# Patient Record
Sex: Female | Born: 1990 | Hispanic: Yes | Marital: Married | Smoking: Never smoker
Health system: Southern US, Community
[De-identification: ages and names within clinical notes are randomized; demographics above are authoritative.]

## PROBLEM LIST (undated history)

## (undated) DIAGNOSIS — O009 Unspecified ectopic pregnancy without intrauterine pregnancy: Secondary | ICD-10-CM

## (undated) HISTORY — DX: Unspecified ectopic pregnancy without intrauterine pregnancy: O00.90

---

## 2016-02-17 ENCOUNTER — Encounter: Payer: Self-pay | Admitting: Emergency Medicine

## 2016-02-17 ENCOUNTER — Emergency Department
Admission: EM | Admit: 2016-02-17 | Discharge: 2016-02-17 | Disposition: A | Discharge status: Home / Self Care | Payer: Self-pay | Attending: Emergency Medicine | Admitting: Emergency Medicine

## 2016-02-17 DIAGNOSIS — K047 Periapical abscess without sinus: Secondary | ICD-10-CM | POA: Insufficient documentation

## 2016-02-17 MED ORDER — TRAMADOL HCL 50 MG PO TABS: 50.0000 mg | ORAL_TABLET | Freq: Four times a day (QID) | ORAL | 0 refills | Status: DC | PRN

## 2016-02-17 MED ORDER — AMOXICILLIN 500 MG PO TABS: 500.0000 mg | ORAL_TABLET | Freq: Three times a day (TID) | ORAL | 0 refills | Status: DC

## 2016-02-17 NOTE — Discharge Instructions (Signed)
Please call and schedule a dental appointment as soon as possible. You will need to be seen within the next 14 days. Return to the emergency department for symptoms that change or worsen if you're unable to schedule an appointment.  OPTIONS FOR DENTAL FOLLOW UP CARE  [place] Department of Health and Human Services - Local Safety Net Dental Clinics http://www.ncdhhs.gov/dph/oralhealth/services/safetynetclinics.htm   [facility] ([phone])  [facility] ([phone])  [facility] ([phone])  [facility] ([phone])  [facility] ([phone]) This clinic caters to the indigent population and is on a lottery system. Location: [facility], [address], [place] Clinic Hours: Wednesdays from 6pm - 9pm, patients seen by a lottery system. For dates, call or go to [email or web address]/[facility]/[email or web address] Services: Cleanings, fillings and simple extractions. Payment Options: DENTAL WORK IS FREE OF CHARGE. Bring proof of income or support. Best way to get seen: Arrive at 5:15 pm - this is a lottery, NOT first come/first serve, so arriving earlier will not increase your chances of being seen.     [facility] [phone] Select option 1 for emergencies   Location: [facility], [address], [place] Clinic Hours: No walk-ins accepted - call the day before to schedule an appointment. Check in times are 9:30 am and 1:30 pm. Services: Simple extractions, temporary fillings, pulpectomy/pulp debridement, uncomplicated abscess drainage. Payment Options: PAYMENT IS DUE AT THE TIME OF SERVICE.  Fee is usually $100-200, additional surgical procedures (e.g. abscess drainage) may be extra. Cash, checks, Visa/MasterCard accepted.  Can file Medicaid if patient is covered for dental - patient should call case worker to check. No  discount for [facility] patients. Best way to get seen: MUST call the day before and get onto the schedule. Can usually be seen the next 1-2 days. No walk-ins accepted.     [facility] [phone]   Location: [facility], [address], [place] [facility] Hours: M, W, Th, F 8am or 1:30pm, Tues 9a or 1:30 - first come/first served. Services: Simple extractions, temporary fillings, uncomplicated abscess drainage.  You do not need to be an [place] resident. Payment Options: PAYMENT IS DUE AT THE TIME OF SERVICE. Dental insurance, otherwise sliding scale - bring proof of income or support. Depending on income and treatment needed, cost is usually $50-200. Best way to get seen: Arrive early as it is first come/first served.     [facility] [phone]   Location: [address] Clinic Hours: Mon-Thu 8a-5p Services: Most basic dental services including extractions and fillings. Payment Options: PAYMENT IS DUE AT THE TIME OF SERVICE. Sliding scale, up to 50% off - bring proof if income or support. Medicaid with dental option accepted. Best way to get seen: Call to schedule an appointment, can usually be seen within 2 weeks OR they will try to see walk-ins - show up at 8a or 2p (you may have to wait).     [facility] [phone] [place] RESIDENTS ONLY   Location: [facility], [address], [place], [place] [address] Clinic Hours: By appointment only. Monday - Thursday 8am-5pm, Friday 8am-12pm Services: Cleanings, fillings, extractions. Payment Options: PAYMENT IS DUE AT THE TIME OF SERVICE. Cash, Visa or MasterCard. Sliding scale - $30 minimum per service. Best way to get seen: Come in to office, complete packet and make an appointment -   need proof of income or support monies for each household member and proof of [place]  residence. Usually takes about a month to get in.     [facility] [phone]   Location: [address] [place] Clinic Hours: Walk-in Urgent Care Dental Services are offered Monday-Friday mornings only. The numbers of emergencies accepted daily is limited to the number of providers available. Maximum 15 - Mondays, Wednesdays & Thursdays Maximum 10 - Tuesdays & Fridays Services: You do not need to be a [place] resident to be seen for a dental emergency. Emergencies are defined as pain, swelling, abnormal bleeding, or dental trauma. Walkins will receive x-rays if needed. NOTE: Dental cleaning is not an emergency. Payment Options: PAYMENT IS DUE AT THE TIME OF SERVICE. Minimum co-pay is $40.00 for uninsured patients. Minimum co-pay is $3.00 for Medicaid with dental coverage. Dental Insurance is accepted and must be presented at time of visit. Medicare does not cover dental. Forms of payment: Cash, credit card, checks. Best way to get seen: If not previously registered with the clinic, walk-in dental registration begins at 7:15 am and is on a first come/first serve basis. If previously registered with the clinic, call to make an appointment.     [facility] [phone] [place] RESIDENTS ONLY   Location: [address], [place], [place] Clinic Hours: Mon-Thu 10a-2p Services: Extractions only! Payment Options: FREE (donations accepted) - bring proof of income or support Best way to get seen: Call and schedule an appointment OR come at 8am on the 1st Monday of every month (except for holidays) when it is first come/first served.     [facility] [phone]   Location: [address], [place] Clinic Hours: Friday mornings Services, Payment Options, Best way to get seen: Call for info  

## 2016-02-17 NOTE — ED Provider Notes (Signed)
[facility] Emergency Department Provider Note ____________________________________________  Time seen: Approximately 4:32 PM  I have reviewed the triage vital signs and the nursing notes.   HISTORY  Chief Complaint Dental Pain   HPI Adrienne Santos is a 25 y.o. female who presents to the emergency department for evaluation of dental pain that started on Wednesday (4 days ago). She denies fever. Swelling to the left jaw for the past 2 days. She has taken ibuprofen without relief.  History reviewed. No pertinent past medical history.  There are no active problems to display for this patient.   Past Surgical History:  Procedure Laterality Date  . CESAREAN SECTION      Prior to Admission medications   Medication Sig Start Date End Date Taking? Authorizing Provider  amoxicillin (AMOXIL) 500 MG tablet Take 1 tablet (500 mg total) by mouth 3 (three) times daily. 02/17/16   Chinita Pesterari B Marvis Bakken, FNP  traMADol (ULTRAM) 50 MG tablet Take 1 tablet (50 mg total) by mouth every 6 (six) hours as needed. 02/17/16   Chinita Pesterari B Onalee Steinbach, FNP    Allergies Patient has no known allergies.  History reviewed. No pertinent family history.  Social History Social History  Substance Use Topics  . Smoking status: Never Smoker  . Smokeless tobacco: Not on file  . Alcohol use Yes    Review of Systems Constitutional: Tearful, uncomfortable appearing. ENT: Positive for dental pain. Musculoskeletal: Negative for trismus. Skin: Positive for swelling without edema. ____________________________________________   PHYSICAL EXAM:  VITAL SIGNS: ED Triage Vitals  Enc Vitals Group     BP 02/17/16 1551 133/84     Pulse Rate 02/17/16 1551 72     Resp 02/17/16 1551 18     Temp 02/17/16 1551 99.1 F (37.3 C)     Temp Source 02/17/16 1551 Oral     SpO2 02/17/16 1551 100 %     Weight 02/17/16 1554 218 lb (98.9 kg)     Height 02/17/16 1554 5\' 9"  (1.753 m)     Head  Circumference --      Peak Flow --      Pain Score 02/17/16 1557 10     Pain Loc --      Pain Edu? --      Excl. in GC? --     Constitutional: Alert and oriented. Well appearing and in no acute distress. Eyes: Conjunctivae are normal.EOMI. Mouth/Throat: Mucous membranes are moist. Oropharynx non-erythematous. Periodontal Exam    Hematological/Lymphatic/Immunilogical: No cervical lymphadenopathy. Respiratory: Normal respiratory effort.  Musculoskeletal: Full ROM x 4 extremities. Neurologic:  Normal speech and language. No gross focal neurologic deficits are appreciated. Speech is normal. No gait instability. Skin:  Swelling over the left maxilla without erythema. Psychiatric: Mood and affect are normal. Speech and behavior are normal.  ____________________________________________   LABS (all labs ordered are listed, but only abnormal results are displayed)  Labs Reviewed - No data to display ____________________________________________   RADIOLOGY  Not indicated. ____________________________________________   PROCEDURES  Procedure(s) performed: None  Critical Care performed: No  ____________________________________________   INITIAL IMPRESSION / ASSESSMENT AND PLAN / ED COURSE  Pertinent labs & imaging results that were available during my care of the patient were reviewed by me and considered in my medical decision making (see chart for details). Patient will be prescribed Amoxicillin and tramadol. Patient was advised to see the dentist within 14 days. Also advised to take the antibiotic until finished. Instructed to return to the ER for  symptoms that change or worsen if unable to schedule an appointment. ____________________________________________   FINAL CLINICAL IMPRESSION(S) / ED DIAGNOSES  Final diagnoses:  Dental abscess    Note:  This document was prepared using Dragon voice recognition software and may include unintentional dictation errors.      Chinita PesterCari B Samary Shatz, FNP 02/17/16 1700    Sharman CheekPhillip Stafford, MD 02/17/16 2308

## 2016-02-17 NOTE — ED Triage Notes (Addendum)
Left upper dental pain since Wednesday. Denies fevers. Has not called dentist. Some swelling to left cheek from tooth. No difficulty swallowing or breathing.

## 2016-06-04 ENCOUNTER — Emergency Department
Admission: EM | Admit: 2016-06-04 | Discharge: 2016-06-04 | Disposition: A | Discharge status: Home / Self Care | Payer: Self-pay | Attending: Emergency Medicine | Admitting: Emergency Medicine

## 2016-06-04 DIAGNOSIS — J111 Influenza due to unidentified influenza virus with other respiratory manifestations: Secondary | ICD-10-CM | POA: Insufficient documentation

## 2016-06-04 LAB — URINALYSIS, COMPLETE (UACMP) WITH MICROSCOPIC
BILIRUBIN URINE: NEGATIVE
Glucose, UA: NEGATIVE mg/dL
HGB URINE DIPSTICK: NEGATIVE
KETONES UR: 20 mg/dL — AB
LEUKOCYTES UA: NEGATIVE
NITRITE: NEGATIVE
PH: 5 (ref 5.0–8.0)
Protein, ur: NEGATIVE mg/dL
SPECIFIC GRAVITY, URINE: 1.025 (ref 1.005–1.030)

## 2016-06-04 LAB — COMPREHENSIVE METABOLIC PANEL
ALBUMIN: 4.1 g/dL (ref 3.5–5.0)
ALT: 18 U/L (ref 14–54)
ANION GAP: 6 (ref 5–15)
AST: 20 U/L (ref 15–41)
Alkaline Phosphatase: 42 U/L (ref 38–126)
BUN: 8 mg/dL (ref 6–20)
CHLORIDE: 105 mmol/L (ref 101–111)
CO2: 26 mmol/L (ref 22–32)
Calcium: 9 mg/dL (ref 8.9–10.3)
Creatinine, Ser: 0.74 mg/dL (ref 0.44–1.00)
GFR calc Af Amer: >60 mL/min (ref >60)
GLUCOSE: 102 mg/dL — AB (ref 65–99)
POTASSIUM: 3.7 mmol/L (ref 3.5–5.1)
Sodium: 137 mmol/L (ref 135–145)
Total Bilirubin: 0.4 mg/dL (ref 0.3–1.2)
Total Protein: 7.6 g/dL (ref 6.5–8.1)

## 2016-06-04 LAB — CBC
HCT: 38.9 % (ref 35.0–47.0)
Hemoglobin: 12.8 g/dL (ref 12.0–16.0)
MCH: 26.8 pg (ref 26.0–34.0)
MCHC: 32.8 g/dL (ref 32.0–36.0)
MCV: 81.7 fL (ref 80.0–100.0)
PLATELETS: 210 10*3/uL (ref 150–440)
RBC: 4.76 MIL/uL (ref 3.80–5.20)
RDW: 13.8 % (ref 11.5–14.5)
WBC: 3.9 10*3/uL (ref 3.6–11.0)

## 2016-06-04 LAB — INFLUENZA PANEL BY PCR (TYPE A & B)
INFLBPCR: POSITIVE — AB
Influenza A By PCR: NEGATIVE

## 2016-06-04 LAB — LIPASE, BLOOD: LIPASE: 33 U/L (ref 11–51)

## 2016-06-04 LAB — POCT PREGNANCY, URINE: Preg Test, Ur: NEGATIVE

## 2016-06-04 MED ORDER — ACETAMINOPHEN 325 MG PO TABS
ORAL_TABLET | ORAL | Status: AC
  Filled 2016-06-04: qty 2

## 2016-06-04 MED ORDER — OSELTAMIVIR PHOSPHATE 75 MG PO CAPS: 75.0000 mg | ORAL_CAPSULE | Freq: Two times a day (BID) | ORAL | 0 refills | Status: AC

## 2016-06-04 MED ORDER — IBUPROFEN 400 MG PO TABS: 600.0000 mg | ORAL_TABLET | Freq: Once | ORAL | Status: DC

## 2016-06-04 MED ORDER — ACETAMINOPHEN 325 MG PO TABS
650.0000 mg | ORAL_TABLET | Freq: Once | ORAL | Status: AC
  Administered 2016-06-04: 650 mg via ORAL

## 2016-06-04 MED ORDER — OSELTAMIVIR PHOSPHATE 75 MG PO CAPS
75.0000 mg | ORAL_CAPSULE | Freq: Once | ORAL | Status: AC
  Administered 2016-06-04: 75 mg via ORAL
  Filled 2016-06-04: qty 1

## 2016-06-04 MED ORDER — SODIUM CHLORIDE 0.9 % IV BOLUS (SEPSIS)
1000.0000 mL | Freq: Once | INTRAVENOUS | Status: AC
  Administered 2016-06-04: 1000 mL via INTRAVENOUS

## 2016-06-04 MED ORDER — ONDANSETRON HCL 4 MG PO TABS: 4.0000 mg | ORAL_TABLET | Freq: Every day | ORAL | 0 refills | Status: DC | PRN

## 2016-06-04 NOTE — ED Provider Notes (Signed)
[facility] Emergency Department Provider Note  ____________________________________________   None    (approximate)  I have reviewed the triage vital signs and the nursing notes.   HISTORY  Chief Complaint Fever   HPI Adrienne Santos is a 26 y.o. female without any chronic medical problems who is presenting to the emergency department tonight with lower abdominal pain as well as nausea vomiting, diarrhea and a cough and runny nose since yesterday. She also has a child at home with similar symptoms. She says the pain is not intended lower abdomen and cramping. She denies any radiation of the pain. Denies any burning with urination. Denies any blood in the stool or vomitus.   No past medical history on file.  There are no active problems to display for this patient.   Past Surgical History:  Procedure Laterality Date  . CESAREAN SECTION      Prior to Admission medications   Medication Sig Start Date End Date Taking? Authorizing Provider  amoxicillin (AMOXIL) 500 MG tablet Take 1 tablet (500 mg total) by mouth 3 (three) times daily. 02/17/16   Chinita Pesterari B Triplett, FNP  ondansetron (ZOFRAN) 4 MG tablet Take 1 tablet (4 mg total) by mouth daily as needed. 06/04/16   Myrna Blazeravid Matthew Schaevitz, MD  oseltamivir (TAMIFLU) 75 MG capsule Take 1 capsule (75 mg total) by mouth 2 (two) times daily. 06/04/16 06/09/16  Myrna Blazeravid Matthew Schaevitz, MD  traMADol (ULTRAM) 50 MG tablet Take 1 tablet (50 mg total) by mouth every 6 (six) hours as needed. 02/17/16   Chinita Pesterari B Triplett, FNP    Allergies Patient has no known allergies.  No family history on file.  Social History Social History  Substance Use Topics  . Smoking status: Never Smoker  . Smokeless tobacco: Not on file  . Alcohol use Yes    Review of Systems Constitutional:fever Eyes: No visual changes. ENT: No sore throat. Cardiovascular: Denies chest pain. Respiratory: Denies shortness of  breath. Gastrointestinal:   No constipation. Genitourinary: Negative for dysuria. Musculoskeletal: Negative for back pain. Skin: Negative for rash. Neurological: Negative for headaches, focal weakness or numbness.  10-point ROS otherwise negative.  ____________________________________________   PHYSICAL EXAM:  VITAL SIGNS: ED Triage Vitals [06/04/16 2005]  Enc Vitals Group     BP 105/71     Pulse Rate (!) 128     Resp 20     Temp (!) 102.7 F (39.3 C)     Temp Source Oral     SpO2 100 %     Weight 218 lb (98.9 kg)     Height 5\' 9"  (1.753 m)     Head Circumference      Peak Flow      Pain Score 9     Pain Loc      Pain Edu?      Excl. in GC?     Constitutional: Alert and oriented. Well appearing and in no acute distress. Eyes: Conjunctivae are normal. PERRL. EOMI. Head: Atraumatic. Nose: No congestion/rhinnorhea. Mouth/Throat: Mucous membranes are moist.  Oropharynx non-erythematous. Neck: No stridor.   Cardiovascular: tachycardic, regular rhythm. Grossly normal heart sounds.   Respiratory: Normal respiratory effort.  No retractions. Lungs CTAB. Gastrointestinal: Soft with mild tenderness diffusely which slightly worse across the lower abdomen. There is no focal tenderness. Specifically no focal tenderness over McBurney's point. Negative Murphy sign. No distention. . Musculoskeletal: No lower extremity tenderness nor edema.  No joint effusions. Neurologic:  Normal speech and language. No  gross focal neurologic deficits are appreciated. No gait instability. Skin:  Skin is warm, dry and intact. No rash noted. Psychiatric: Mood and affect are normal. Speech and behavior are normal.  ____________________________________________   LABS (all labs ordered are listed, but only abnormal results are displayed)  Labs Reviewed  COMPREHENSIVE METABOLIC PANEL - Abnormal; Notable for the following:       Result Value   Glucose, Bld 102 (*)    All other components within  normal limits  URINALYSIS, COMPLETE (UACMP) WITH MICROSCOPIC - Abnormal; Notable for the following:    Color, Urine YELLOW (*)    APPearance CLEAR (*)    Ketones, ur 20 (*)    Bacteria, UA RARE (*)    Squamous Epithelial / LPF 0-5 (*)    All other components within normal limits  INFLUENZA PANEL BY PCR (TYPE A & B) - Abnormal; Notable for the following:    Influenza B By PCR POSITIVE (*)    All other components within normal limits  CBC  LIPASE, BLOOD  POC URINE PREG, ED  POCT PREGNANCY, URINE   ____________________________________________  EKG   ____________________________________________  RADIOLOGY   ____________________________________________   PROCEDURES  Procedure(s) performed:   Procedures  Critical Care performed:   ____________________________________________   INITIAL IMPRESSION / ASSESSMENT AND PLAN / ED COURSE  Pertinent labs & imaging results that were available during my care of the patient were reviewed by me and considered in my medical decision making (see chart for details).  ----------------------------------------- 10:48 PM on 06/04/2016 -----------------------------------------  Patient at this time with a heart rate of 94 and temperature 99.5. Positive for influenza B.  No vomiting in the emergency department. The diagnosis as well as treatment plan the patient and her family who is at the bedside. The patient was her on Tamiflu because she is within the 48 hour window. She will also given Zofran for nausea prior to arrival and possible side effect of nausea from the Tamiflu. She does not report a primary care doctor. I will give her the [facility]. She is understanding of the plan and willing to comply. To return for any worsening or concerning symptoms.      ____________________________________________   FINAL CLINICAL IMPRESSION(S) / ED DIAGNOSES  Final diagnoses:  Influenza      NEW  MEDICATIONS STARTED DURING THIS VISIT:  New Prescriptions   ONDANSETRON (ZOFRAN) 4 MG TABLET    Take 1 tablet (4 mg total) by mouth daily as needed.   OSELTAMIVIR (TAMIFLU) 75 MG CAPSULE    Take 1 capsule (75 mg total) by mouth 2 (two) times daily.     Note:  This document was prepared using Dragon voice recognition software and may include unintentional dictation errors.    Myrna Blazer, MD 06/04/16 [phone]

## 2016-06-04 NOTE — ED Triage Notes (Signed)
Pt has n.v.d since yest and lower abd pain, states has also had fever. Denies any dysuria also co bilat breast pain.

## 2016-07-08 ENCOUNTER — Encounter: Payer: Self-pay | Admitting: *Deleted

## 2016-07-08 ENCOUNTER — Emergency Department: Payer: Self-pay

## 2016-07-08 ENCOUNTER — Emergency Department
Admission: EM | Admit: 2016-07-08 | Discharge: 2016-07-08 | Disposition: A | Discharge status: Home / Self Care | Payer: Self-pay | Attending: Emergency Medicine | Admitting: Emergency Medicine

## 2016-07-08 DIAGNOSIS — N898 Other specified noninflammatory disorders of vagina: Secondary | ICD-10-CM | POA: Insufficient documentation

## 2016-07-08 DIAGNOSIS — O4690 Antepartum hemorrhage, unspecified, unspecified trimester: Secondary | ICD-10-CM

## 2016-07-08 DIAGNOSIS — N939 Abnormal uterine and vaginal bleeding, unspecified: Secondary | ICD-10-CM

## 2016-07-08 DIAGNOSIS — Z3A00 Weeks of gestation of pregnancy not specified: Secondary | ICD-10-CM | POA: Insufficient documentation

## 2016-07-08 DIAGNOSIS — O209 Hemorrhage in early pregnancy, unspecified: Secondary | ICD-10-CM | POA: Insufficient documentation

## 2016-07-08 LAB — URINALYSIS, COMPLETE (UACMP) WITH MICROSCOPIC
Bacteria, UA: NONE SEEN
Bilirubin Urine: NEGATIVE
Glucose, UA: NEGATIVE mg/dL
KETONES UR: NEGATIVE mg/dL
Leukocytes, UA: NEGATIVE
Nitrite: NEGATIVE
PH: 6 (ref 5.0–8.0)
Protein, ur: NEGATIVE mg/dL
Specific Gravity, Urine: 1.018 (ref 1.005–1.030)

## 2016-07-08 LAB — ABO/RH: ABO/RH(D): A POS

## 2016-07-08 LAB — CBC
HCT: 34.1 % — ABNORMAL LOW (ref 35.0–47.0)
Hemoglobin: 11.3 g/dL — ABNORMAL LOW (ref 12.0–16.0)
MCH: 27 pg (ref 26.0–34.0)
MCHC: 33 g/dL (ref 32.0–36.0)
MCV: 81.7 fL (ref 80.0–100.0)
Platelets: 331 10*3/uL (ref 150–440)
RBC: 4.17 MIL/uL (ref 3.80–5.20)
RDW: 14.4 % (ref 11.5–14.5)
WBC: 7.9 10*3/uL (ref 3.6–11.0)

## 2016-07-08 LAB — HCG, QUANTITATIVE, PREGNANCY: HCG, BETA CHAIN, QUANT, S: 247 m[IU]/mL — AB (ref <5)

## 2016-07-08 LAB — POCT PREGNANCY, URINE: Preg Test, Ur: POSITIVE — AB

## 2016-07-08 NOTE — ED Provider Notes (Signed)
[facility] Emergency Department Provider Note   ____________________________________________   I have reviewed the triage vital signs and the nursing notes.   HISTORY  Chief Complaint Vaginal Bleeding   History limited by: Language [facility] Interpreter utilized   HPI Adrienne Santos is a 26 y.o. female who presents to the emergency department today because of concerns for vaginal bleeding. The patient states that she had her previous. Roughly 1 month ago. Since that time she has had some vaginal spotting although has had heavy vaginal bleeding for the past 10 days. She did have some blood clots however has not had blood clots past 3 days. This has been accompanied by some abdominal cramping primarily lubricated in the suprapubic and right lower quadrant. Patient states that typically she is fairly regular. She was seen in the emergency department little over a month ago and had a negative pregnancy test.   History reviewed. No pertinent past medical history.  There are no active problems to display for this patient.   Past Surgical History:  Procedure Laterality Date  . CESAREAN SECTION      Prior to Admission medications   Medication Sig Start Date End Date Taking? Authorizing Provider  amoxicillin (AMOXIL) 500 MG tablet Take 1 tablet (500 mg total) by mouth 3 (three) times daily. 02/17/16   Chinita Pester, FNP  ondansetron (ZOFRAN) 4 MG tablet Take 1 tablet (4 mg total) by mouth daily as needed. 06/04/16   Myrna Blazer, MD  traMADol (ULTRAM) 50 MG tablet Take 1 tablet (50 mg total) by mouth every 6 (six) hours as needed. 02/17/16   Chinita Pester, FNP    Allergies Patient has no known allergies.  History reviewed. No pertinent family history.  Social History Social History  Substance Use Topics  . Smoking status: Never Smoker  . Smokeless tobacco: Not on file  . Alcohol use Yes    Review of  Systems  Constitutional: Negative for fever. Cardiovascular: Negative for chest pain. Respiratory: Negative for shortness of breath. Gastrointestinal: Positive for abdominal pain. Genitourinary: Positive for vaginal bleeding. Musculoskeletal: Negative for back pain. Skin: Negative for rash. Neurological: Negative for headaches, focal weakness or numbness.  10-point ROS otherwise negative.  ____________________________________________   PHYSICAL EXAM:  VITAL SIGNS: ED Triage Vitals  Enc Vitals Group     BP 07/08/16 1644 114/67     Pulse Rate 07/08/16 1644 78     Resp 07/08/16 1644 18     Temp 07/08/16 1644 98.6 F (37 C)     Temp Source 07/08/16 1644 Oral     SpO2 07/08/16 1644 100 %     Weight 07/08/16 1647 219 lb (99.3 kg)     Height 07/08/16 1647 5\' 9"  (1.753 m)     Head Circumference --      Peak Flow --      Pain Score 07/08/16 1647 10    Constitutional: Alert and oriented. Well appearing and in no distress. Eyes: Conjunctivae are normal. Normal extraocular movements. ENT   Head: Normocephalic and atraumatic.   Nose: No congestion/rhinnorhea.   Mouth/Throat: Mucous membranes are moist.   Neck: No stridor. Hematological/Lymphatic/Immunilogical: No cervical lymphadenopathy. Cardiovascular: Normal rate, regular rhythm.  No murmurs, rubs, or gallops.  Respiratory: Normal respiratory effort without tachypnea nor retractions. Breath sounds are clear and equal bilaterally. No wheezes/rales/rhonchi. Gastrointestinal: Soft and minimally tender in the suprapubic region. No rebound. No guarding.  Genitourinary: Deferred Musculoskeletal: Normal range of motion in  all extremities. No lower extremity edema. Neurologic:  Normal speech and language. No gross focal neurologic deficits are appreciated.  Skin:  Skin is warm, dry and intact. No rash noted. Psychiatric: Mood and affect are normal. Speech and behavior are normal. Patient exhibits appropriate insight and  judgment.  ____________________________________________    LABS (pertinent positives/negatives)  Labs Reviewed  CBC - Abnormal; Notable for the following:       Result Value   Hemoglobin 11.3 (*)    HCT 34.1 (*)    All other components within normal limits  HCG, QUANTITATIVE, PREGNANCY - Abnormal; Notable for the following:    hCG, Beta Chain, Quant, S 247 (*)    All other components within normal limits  URINALYSIS, COMPLETE (UACMP) WITH MICROSCOPIC - Abnormal; Notable for the following:    Color, Urine YELLOW (*)    APPearance HAZY (*)    Hgb urine dipstick LARGE (*)    Squamous Epithelial / LPF 0-5 (*)    All other components within normal limits  POCT PREGNANCY, URINE - Abnormal; Notable for the following:    Preg Test, Ur POSITIVE (*)    All other components within normal limits  POC URINE PREG, ED  ABO/RH     ____________________________________________   EKG  None  ____________________________________________    RADIOLOGY  US IMPRESSION: Complex fluid in the endometrial canal consistent with hemorrhage. There is no evidence of intrauterine pregnancy. No definable ectopic pregnancy. Follow-up obviously suggested.   ____________________________________________   PROCEDURES  Procedures  ____________________________________________   INITIAL IMPRESSION / ASSESSMENT AND PLAN / ED COURSE  Pertinent labs & imaging results that were available during my care of the patient were reviewed by me and considered in my medical decision making (see chart for details).   Patient presented to the emergency department today because of concerns for vaginal bleeding. Here a urine pregnancy test was positive. An ultrasound was performed which did not show obvious location of pregnancy. Patient in no acute distress. The patient's bhg 247. Discussed with patient that it could be a very early pregnancy and that she will need repeat testing. Will give ob/gyn follow  up.  ____________________________________________   FINAL CLINICAL IMPRESSION(S) / ED DIAGNOSES  Final diagnoses:  Vaginal bleeding  Vaginal bleeding in pregnancy     Note: This dictation was prepared with Dragon dictation. Any transcriptional errors that result from this process are unintentional     Phineas SemenGraydon Cadan Maggart, MD 07/08/16 [phone]

## 2016-07-08 NOTE — Discharge Instructions (Signed)
It is important that you have repeat blood testing done in 2-3 days. Please seek medical attention for any high fevers, chest pain, shortness of breath, change in behavior, persistent vomiting, bloody stool or any other new or concerning symptoms.

## 2016-07-08 NOTE — ED Triage Notes (Signed)
interpreter # [phone], states vaginal bleeding for 10 days, states LMP was 2/26, states bright red blood, awake and alert

## 2016-07-08 NOTE — ED Notes (Signed)
Interpreter requested 

## 2016-07-18 ENCOUNTER — Ambulatory Visit (INDEPENDENT_AMBULATORY_CARE_PROVIDER_SITE_OTHER): Payer: Self-pay | Admitting: Obstetrics and Gynecology

## 2016-07-18 ENCOUNTER — Encounter: Payer: Self-pay | Admitting: Obstetrics and Gynecology

## 2016-07-18 DIAGNOSIS — O009 Unspecified ectopic pregnancy without intrauterine pregnancy: Secondary | ICD-10-CM

## 2016-07-18 DIAGNOSIS — O200 Threatened abortion: Secondary | ICD-10-CM

## 2016-07-18 NOTE — Progress Notes (Signed)
Obstetrics & Gynecology Office Visit   Chief Complaint  Patient presents with  . Threatened Miscarriage versus ectopic pregnancy.  Referred by [facility]   History of Present Illness:  Threatened Abortion: Patient at 5 and 3/[redacted] weeks gestation by LMP.  Patient reports bleeding and pelvic pain since 07/08/16.  She is not in acute distress.  Ectopic risks include none. Cycle length is regular. Pregnancy testing: qual HCG positive on 07/08/16. Pregnancy imaging: transvaginal ultrasound done on 07/08/16. Result no IUP, no evidence of ectopic.  Blood type: A positive.  Other lab results: quant hCG on 3/27 247. Quantitative hCG history: 3/27: 261 at [facility] 3/28: 153 at [facility] (lab report faxed to me) 4/2: 232 at [facility] (lab report faxed to me)  She continues to have daily bleeding, changing pads 10 times per day. She states she has RLQ pain that is slightly better than before, but still present.  Review of Systems: Review of Systems  Constitutional: Negative.   HENT: Negative.   Eyes: Negative.   Respiratory: Negative.   Cardiovascular: Negative.   Gastrointestinal: Positive for abdominal pain (RLQ).  Genitourinary:       Vaginal bleeding since 07/08/16  Musculoskeletal: Negative.   Skin: Negative.   Neurological: Negative.   Endo/Heme/Allergies: Negative.   Psychiatric/Behavioral: Negative.     Past Medical History: None   Past Surgical History:  Procedure Laterality Date  . CESAREAN SECTION     Gynecologic History: about 06/10/16 per patient  Obstetric History: G2P1001, s/p primary c-section, complicated by preeclampsia  Family History  Problem Relation Age of Onset  . Hypertension Mother   . Diabetes Mother    Social History   Social History  . Marital status: Married    Spouse name: N/A  . Number of children: N/A  . Years of education: N/A   Occupational History  . Not on file.   Social History Main Topics  . Smoking status: Never  Smoker  . Smokeless tobacco: Never Used  . Alcohol use Yes  . Drug use: No  . Sexual activity: Yes    Birth control/ protection: None   Other Topics Concern  . Not on file   Social History Narrative  . No narrative on file   Allergies: No Known Allergies  Medications:   Medication Sig Start Date End Date Taking? Authorizing Provider  acetaminophen (TYLENOL) 160 MG/5ML elixir Take 15 mg/kg by mouth every 4 (four) hours as needed for fever.   Yes Historical Provider, MD   Physical Exam BP 110/70   Pulse 79   Ht  (1.753 m)   Wt 219 lb (99.3 kg)   BMI 32.34 kg/m   No LMP recorded.  General: NAD HEENT: normocephalic, anicteric Thyroid: no enlargement, no palpable nodules Pulmonary: No increased work of breathing Cardiovascular: RRR, distal pulses 2+ Abdomen: NABS, soft, non-tender, non-distended.  Umbilicus without lesions.  No hepatomegaly, splenomegaly or masses palpable. No evidence of hernia  Genitourinary: deferred Extremities: no edema, erythema, or tenderness Neurologic: Grossly intact Psychiatric: mood appropriate, affect full  Assessment: 26 y.o. G2P1001 with incomplete spontaneous abortion versus ectopic pregnancy.  Given the decrease in her hCG levels followed by a rise, this pregnancy is not normal.  Discussed the above possibilities. Her pain increases the risk of an ectopic pregnancy, especially since it is localized.   Discussed surgical versus medical management.  Plan: Recommend she present to [facility] ER for management of her abnormal pregnancy that has a high  probability to be an ectopic. She will need 107.  of methotrexate followed by the usual labs for a single-dose protocol.  Reviewed the risks of MTX and the risks of an untreated ectopic pregnancy. We discussed that I could not say definitively that the pregnancy is ectopic.  However, there is a high likelihood.  She voiced understanding and agreement with the plan.  30 minutes spent in face to face  discussion with > 50% spent in counseling and management of her abnormal pregnancy.   Thomasene Mohair, MD 07/18/2016 5:33 PM

## 2016-07-19 ENCOUNTER — Inpatient Hospital Stay (HOSPITAL_COMMUNITY)
Admission: AD | Admit: 2016-07-19 | Discharge: 2016-07-19 | Disposition: A | Discharge status: Home / Self Care | Payer: Self-pay | Source: Ambulatory Visit | Attending: Obstetrics and Gynecology | Admitting: Obstetrics and Gynecology

## 2016-07-19 ENCOUNTER — Inpatient Hospital Stay (HOSPITAL_COMMUNITY): Payer: Self-pay

## 2016-07-19 ENCOUNTER — Encounter (HOSPITAL_COMMUNITY): Payer: Self-pay

## 2016-07-19 DIAGNOSIS — Z8249 Family history of ischemic heart disease and other diseases of the circulatory system: Secondary | ICD-10-CM | POA: Insufficient documentation

## 2016-07-19 DIAGNOSIS — O209 Hemorrhage in early pregnancy, unspecified: Secondary | ICD-10-CM | POA: Insufficient documentation

## 2016-07-19 DIAGNOSIS — Z3A00 Weeks of gestation of pregnancy not specified: Secondary | ICD-10-CM | POA: Insufficient documentation

## 2016-07-19 DIAGNOSIS — Z833 Family history of diabetes mellitus: Secondary | ICD-10-CM | POA: Insufficient documentation

## 2016-07-19 LAB — COMPREHENSIVE METABOLIC PANEL
ALBUMIN: 3.8 g/dL (ref 3.5–5.0)
ALT: 20 U/L (ref 14–54)
ANION GAP: 6 (ref 5–15)
AST: 18 U/L (ref 15–41)
Alkaline Phosphatase: 37 U/L — ABNORMAL LOW (ref 38–126)
BUN: 14 mg/dL (ref 6–20)
CO2: 28 mmol/L (ref 22–32)
Calcium: 8.9 mg/dL (ref 8.9–10.3)
Chloride: 103 mmol/L (ref 101–111)
Creatinine, Ser: 0.54 mg/dL (ref 0.44–1.00)
GFR calc Af Amer: >60 mL/min (ref >60)
GFR calc non Af Amer: >60 mL/min (ref >60)
GLUCOSE: 92 mg/dL (ref 65–99)
Potassium: 4 mmol/L (ref 3.5–5.1)
SODIUM: 137 mmol/L (ref 135–145)
Total Bilirubin: 0.3 mg/dL (ref 0.3–1.2)
Total Protein: 7.2 g/dL (ref 6.5–8.1)

## 2016-07-19 LAB — CBC WITH DIFFERENTIAL/PLATELET
BASOS PCT: 1 %
Basophils Absolute: 0 10*3/uL (ref 0.0–0.1)
EOS ABS: 0.1 10*3/uL (ref 0.0–0.7)
Eosinophils Relative: 2 %
HCT: 33.6 % — ABNORMAL LOW (ref 36.0–46.0)
Hemoglobin: 10.8 g/dL — ABNORMAL LOW (ref 12.0–15.0)
Lymphocytes Relative: 36 %
Lymphs Abs: 2 10*3/uL (ref 0.7–4.0)
MCH: 26.5 pg (ref 26.0–34.0)
MCHC: 32.1 g/dL (ref 30.0–36.0)
MCV: 82.4 fL (ref 78.0–100.0)
MONO ABS: 0.4 10*3/uL (ref 0.1–1.0)
MONOS PCT: 8 %
NEUTROS PCT: 55 %
Neutro Abs: 3.1 10*3/uL (ref 1.7–7.7)
Platelets: 351 10*3/uL (ref 150–400)
RBC: 4.08 MIL/uL (ref 3.87–5.11)
RDW: 14.1 % (ref 11.5–15.5)
WBC: 5.7 10*3/uL (ref 4.0–10.5)

## 2016-07-19 LAB — URINALYSIS, ROUTINE W REFLEX MICROSCOPIC
Bilirubin Urine: NEGATIVE
Glucose, UA: NEGATIVE mg/dL
Ketones, ur: NEGATIVE mg/dL
Leukocytes, UA: NEGATIVE
Nitrite: NEGATIVE
PH: 7 (ref 5.0–8.0)
PROTEIN: NEGATIVE mg/dL
Specific Gravity, Urine: 1.019 (ref 1.005–1.030)

## 2016-07-19 LAB — HCG, QUANTITATIVE, PREGNANCY: hCG, Beta Chain, Quant, S: 78 m[IU]/mL — ABNORMAL HIGH (ref <5)

## 2016-07-19 LAB — POCT PREGNANCY, URINE: Preg Test, Ur: POSITIVE — AB

## 2016-07-19 MED ORDER — METHOTREXATE INJECTION FOR WOMEN'S HOSPITAL
50.0000 mg/m2 | Freq: Once | INTRAMUSCULAR | Status: AC
  Administered 2016-07-19: 110 mg via INTRAMUSCULAR
  Filled 2016-07-19: qty 2.2

## 2016-07-19 NOTE — Progress Notes (Addendum)
1335: Provider with interpreter at [facility] assessing pt. Pt bleeding since march 27. Last menstrual Feb 18. Irregular. U/s report undetermined ectopic but pt was prescribed methatrixate POC discussed with pt.   1337: Lab at [facility].   1441: awaiting U/S

## 2016-07-19 NOTE — MAU Provider Note (Signed)
History   G2P1001  With LMP 06/01/16 and vag bleeding and abd pain since 06/28/16. Pain is on pts right side. US done in [place] non conclusive.  CSN: [id number]  Arrival date & time 07/19/16  1237   None     Chief Complaint  Patient presents with  . Ectopic Pregnancy    HPI  History reviewed. No pertinent past medical history.  Past Surgical History:  Procedure Laterality Date  . CESAREAN SECTION      Family History  Problem Relation Age of Onset  . Hypertension Mother   . Diabetes Mother     Social History  Substance Use Topics  . Smoking status: Never Smoker  . Smokeless tobacco: Never Used  . Alcohol use Yes    OB History    Gravida Para Term Preterm AB Living   SAB TAB Ectopic Multiple Live Births           1      Review of Systems  Constitutional: Negative.   HENT: Negative.   Eyes: Negative.   Respiratory: Negative.   Cardiovascular: Negative.   Gastrointestinal: Positive for abdominal pain.  Endocrine: Negative.   Genitourinary: Positive for vaginal bleeding.  Musculoskeletal: Negative.   Skin: Negative.     Allergies  Patient has no known allergies.  Home Medications    BP 125/83 (BP Location: Right Arm)   Pulse 79   Temp 98.2 F (36.8 C)   Resp 16   Ht  (1.753 m)   Wt 220 lb (99.8 kg)   LMP 06/01/2016   BMI 32.49 kg/m   Physical Exam  Constitutional: She is oriented to person, place, and time. She appears well-developed and well-nourished.  HENT:  Head: Normocephalic.  Eyes: Pupils are equal, round, and reactive to light.  Neck: Normal range of motion.  Cardiovascular: Normal rate, regular rhythm, normal heart sounds and intact distal pulses.   Pulmonary/Chest: Effort normal and breath sounds normal.  Abdominal: Soft. There is tenderness.  Genitourinary: Vagina normal and uterus normal.  Musculoskeletal: Normal range of motion.  Neurological: She is alert and oriented to person, place, and time. She has  normal reflexes.  Skin: Skin is warm and dry.  Psychiatric: She has a normal mood and affect. Her behavior is normal. Judgment and thought content normal.    MAU Course  Procedures (including critical care time)  Labs Reviewed  URINALYSIS, ROUTINE W REFLEX MICROSCOPIC - Abnormal; Notable for the following:       Result Value   APPearance HAZY (*)    Hgb urine dipstick LARGE (*)    Bacteria, UA RARE (*)    Squamous Epithelial / LPF 0-5 (*)    All other components within normal limits  HCG, QUANTITATIVE, PREGNANCY   No results found.   1. Vaginal bleeding in pregnancy, first trimester       MDM  sm amt bright vag bleeding. mod tenderness on pt right side with exam. VSS. US shows large complex mass. Radiologists notified me of results that is strongly suspicious of right ectopic pregnancy. Dr. Gomez Cleverly made aware of diagnosis and in to see pt. [place] results reviewed by Dr. Vergie Living and he recommended methotrexate to pt since quant was dropping. Pt to receive methotrexate and follow up in clinic on Tuesday and Friday.

## 2016-07-19 NOTE — Discharge Instructions (Signed)
Tratamiento con metotrexato para el embarazo ectópico, cuidados posteriores  (Methotrexate Treatment for an Ectopic Pregnancy, Care After)  Siga estas indicaciones durante las próximas semanas. Estas indicaciones le proporcionan información general acerca de cómo deberá cuidarse después del procedimiento. El médico también podrá darle indicaciones más específicas. El tratamiento se ha planificado de acuerdo a las prácticas médicas actuales, pero a veces pueden ocurrir problemas. Comuníquese con el médico si tiene algún problema o tiene dudas después del procedimiento.  QUÉ ESPERAR DESPUÉS DEL PROCEDIMIENTO  Puede tener algunos cólicos abdominales, hemorragia vaginal y fatiga durante los primeros días después de recibir el metotrexato. Otros efectos secundarios posibles del metotrexato incluyen:  · Náuseas.  · Vómitos.  · Diarrea.  · Llagas en la boca.  · Hinchazón o irritación del revestimiento pulmonar (neumonitis).  · Daño hepático.  · Pérdida del cabello.  INSTRUCCIONES PARA EL CUIDADO EN EL HOGAR  Luego de recibir el metotrexato, debe tener cuidado con las actividades que realiza, y controlar su afección durante algunas semanas. Puede pasar 1 semana antes de que los niveles hormonales se normalicen.  · Cumpla con todas las visitas de control, según le indique su médico.  · Evite viajar a sitios alejados de donde esté su médico.  · No tenga relaciones sexuales hasta que el médico le diga que es seguro.  · Puede retomar su dieta habitual.  · Limite las actividades extenuantes.  · No tome ácido fólico, vitaminas prenatales ni otras vitaminas que contengan ácido fólico.  · No tome aspirina, ibuprofeno ni naproxeno (antiinflamatorios no esteroides [AINE]).  · No beba alcohol.  SOLICITE ATENCIÓN MÉDICA SI:  · No puede controlar las náuseas y los vómitos.  · No puede controlar la diarrea.  · Tiene llagas en la boca y quiere tratamiento.  · Necesita analgésicos más fuertes para su dolor.  · Tiene una erupción  cutánea.  · Tiene una reacción adversa a los medicamentos.    SOLICITE ATENCIÓN MÉDICA DE INMEDIATO SI:  · Tiene cada vez más dolor abdominal o pélvico.  · Observa un aumento de la hemorragia.  · Se siente mareada o se desmaya.  · Le falta el aire.  · Aumenta su frecuencia cardíaca.  · Tiene tos.  · Tiene escalofríos.  · Tiene fiebre.    Esta información no tiene como fin reemplazar el consejo del médico. Asegúrese de hacerle al médico cualquier pregunta que tenga.  Document Released: 03/20/2011 Document Revised: 04/05/2013 Document Reviewed: 01/17/2013  Elsevier Interactive Patient Education © 2017 [employer]

## 2016-07-19 NOTE — MAU Note (Signed)
Patient was told by a doctor in [place] she has an ectopic pregnancy, was told to follow up here, had [place], 3/28 went in for HCG then on 3/29 and then this past Monday, hormones doubled last HCG 261, having pain on right side

## 2016-07-19 NOTE — Progress Notes (Addendum)
MAU GYN Note  26 y/o G2P1001 (LMP: mid February). Patient states she present to MAU today b/c she was told by her GYN MD to come to the hospital for ectopic pregnancy. Note from yesterday states [facility] but she states she was told to come to [facility] b/c of cost; she states she lives in [place]. Patient states she's had bleeding and low belly discomfort since 3/17. She states the low belly discomfort used to be constant, when it started, but it now comes and goes and she states that she has 3 pad per day, non saturated, bleeding.  No h/o STI and not on anything for contraception.  She states she is not breastfeeding. From the chart:  06/04/2016: UPT @ [facility] negative; pt there for fever and GI s/s 3/27: 247 @ [facility] for VB. u/s negative except for complex fluid in the endometrial canal. 3/28: 153 @ [facility] 4/2: 232 @ [facility] 4/7: 78  CLINICAL DATA:  Vaginal bleeding since 06/28/2016. LMP sometime in February. Quantitative HCG decreasing and presently 78.  EXAM: TRANSVAGINAL OB ULTRASOUND  TECHNIQUE: Transvaginal ultrasound was performed for complete evaluation of the gestation as well as the maternal uterus, adnexal regions, and pelvic cul-de-sac.  COMPARISON:  07/08/2016  FINDINGS: Intrauterine gestational sac: Not visualized.  Yolk sac:  Not visualized.  Embryo:  Not visualized.  Cardiac Activity: Not visualized.  Heart Rate: Not visualized. Bpm  Maternal uterus/adnexae: Left ovary normal in size, shape and position measuring 2.0 x 2.6 x 2.7 cm. Right ovary normal in size, shape position measuring 3.2 x 3.9 x 4.0 cm. There is a heterogeneous complex cystic mass within the right adnexa abutting the right ovary measuring approximately 4.1 x 6.5 x 7.4 cm. This mass lies anterior superior to the ovary extends above the level of the uterine fundus. No significant internal vascularity. No significant free pelvic fluid. This mass is somewhat difficult to image  due to patient body habitus and location in the pelvis.  IMPRESSION: No evidence of intrauterine gestational sac.  Complex cystic mass in the right adnexa abutting the ovary measuring 4.1 x 7 x 4 x 6.5 cm. This likely represents a right tubal process such as a tubal ectopic pregnancy/ hemorrhage given patient's persistent positive quantitative HCG. Recommend GYN consultation.  Critical Value/emergent results were called by telephone at the time of interpretation on 07/19/2016 at 3:17 pm to Dr. Wyvonnia Dusky , who verbally acknowledged these results.   Electronically Signed   By: Elberta Fortis M.D.   On: 07/19/2016 15:17  A POS   AF VS normal and stable NAD Abd: obese, nttp, nd   A/P: D/w pt re: options. Although ectopic is on the larger size, it has no internal flow, there is no FF in the pelvis and the beta has dropped by approx 150 since 4/2 w/o any medications. D/w her re: importance of follow up, ER precautions and possibility of MTX failure vs risks associated with surgery. I told her that I recommend MTX given the above and her age, which she . Will follow up cbc and cmp and if normal proceed with single dose MTX. D/w pt that I recommend coming back to [facility] for Day 4 and 7 follow up b/c will be able to do her stat betas faster than at [facility], which she is amenable to.  Patient seen with interpreter  Cornelia Copa MD Attending Center for [facility] (Faculty Practice) 07/19/2016 Time: [phone]

## 2016-07-22 ENCOUNTER — Ambulatory Visit: Payer: Self-pay

## 2016-07-22 DIAGNOSIS — O3680X0 Pregnancy with inconclusive fetal viability, not applicable or unspecified: Secondary | ICD-10-CM

## 2016-07-22 LAB — HCG, QUANTITATIVE, PREGNANCY: hCG, Beta Chain, Quant, S: 41 m[IU]/mL — ABNORMAL HIGH (ref <5)

## 2016-07-22 NOTE — Progress Notes (Addendum)
Patient presented to the office today for bhcg(stat). Patient reports some pain but no bleeding at this time. Patient has been instructed to wait in our lobby for results. Provider Dorathy Kinsman has been informed of lab results and stated patient should follow up with another quant level check not stat on next Tuesday 07/29/2016. Patient verbalizes understanding at this time.

## 2016-07-25 ENCOUNTER — Ambulatory Visit: Payer: Medicaid Other

## 2016-07-29 ENCOUNTER — Other Ambulatory Visit: Payer: Self-pay

## 2016-07-29 DIAGNOSIS — O200 Threatened abortion: Secondary | ICD-10-CM

## 2016-07-30 LAB — BETA HCG QUANT (REF LAB): HCG QUANT: 8 m[IU]/mL

## 2016-08-10 ENCOUNTER — Encounter: Payer: Self-pay | Admitting: Emergency Medicine

## 2016-08-10 DIAGNOSIS — R7989 Other specified abnormal findings of blood chemistry: Secondary | ICD-10-CM | POA: Insufficient documentation

## 2016-08-10 DIAGNOSIS — N83201 Unspecified ovarian cyst, right side: Secondary | ICD-10-CM | POA: Insufficient documentation

## 2016-08-10 DIAGNOSIS — R102 Pelvic and perineal pain: Secondary | ICD-10-CM | POA: Insufficient documentation

## 2016-08-10 LAB — COMPREHENSIVE METABOLIC PANEL
ALBUMIN: 4.2 g/dL (ref 3.5–5.0)
ALK PHOS: 46 U/L (ref 38–126)
ALT: 18 U/L (ref 14–54)
AST: 20 U/L (ref 15–41)
Anion gap: 7 (ref 5–15)
BILIRUBIN TOTAL: 0.2 mg/dL — AB (ref 0.3–1.2)
BUN: 13 mg/dL (ref 6–20)
CALCIUM: 9.5 mg/dL (ref 8.9–10.3)
CO2: 29 mmol/L (ref 22–32)
Chloride: 103 mmol/L (ref 101–111)
Creatinine, Ser: 0.66 mg/dL (ref 0.44–1.00)
GFR calc Af Amer: >60 mL/min (ref >60)
GLUCOSE: 74 mg/dL (ref 65–99)
POTASSIUM: 3.8 mmol/L (ref 3.5–5.1)
Sodium: 139 mmol/L (ref 135–145)
TOTAL PROTEIN: 7.5 g/dL (ref 6.5–8.1)

## 2016-08-10 LAB — URINALYSIS, ROUTINE W REFLEX MICROSCOPIC
BILIRUBIN URINE: NEGATIVE
Bacteria, UA: NONE SEEN
GLUCOSE, UA: NEGATIVE mg/dL
Ketones, ur: NEGATIVE mg/dL
LEUKOCYTES UA: NEGATIVE
NITRITE: NEGATIVE
PH: 6 (ref 5.0–8.0)
Protein, ur: NEGATIVE mg/dL
SPECIFIC GRAVITY, URINE: 1.018 (ref 1.005–1.030)

## 2016-08-10 LAB — CBC
HCT: 37 % (ref 35.0–47.0)
Hemoglobin: 12.2 g/dL (ref 12.0–16.0)
MCH: 26.5 pg (ref 26.0–34.0)
MCHC: 33.1 g/dL (ref 32.0–36.0)
MCV: 80.1 fL (ref 80.0–100.0)
PLATELETS: 284 10*3/uL (ref 150–440)
RBC: 4.61 MIL/uL (ref 3.80–5.20)
RDW: 14.5 % (ref 11.5–14.5)
WBC: 6.6 10*3/uL (ref 3.6–11.0)

## 2016-08-10 LAB — HCG, QUANTITATIVE, PREGNANCY: hCG, Beta Chain, Quant, S: 3 m[IU]/mL (ref <5)

## 2016-08-10 NOTE — ED Triage Notes (Signed)
Reports diagnosed with ectopic pregnancy 3 weeks ago and received injection.  Had follow up blood work but does not know results.  Reports pain and vaginal bleeding.

## 2016-08-11 ENCOUNTER — Encounter: Payer: Self-pay | Admitting: Obstetrics and Gynecology

## 2016-08-11 ENCOUNTER — Ambulatory Visit (INDEPENDENT_AMBULATORY_CARE_PROVIDER_SITE_OTHER): Payer: Self-pay | Admitting: Obstetrics and Gynecology

## 2016-08-11 ENCOUNTER — Emergency Department: Payer: Medicaid Other

## 2016-08-11 ENCOUNTER — Ambulatory Visit: Payer: Self-pay | Admitting: Obstetrics and Gynecology

## 2016-08-11 ENCOUNTER — Emergency Department
Admission: EM | Admit: 2016-08-11 | Discharge: 2016-08-11 | Disposition: A | Discharge status: Home / Self Care | Payer: Self-pay | Attending: Emergency Medicine | Admitting: Emergency Medicine

## 2016-08-11 VITALS — BP 110/70 | HR 69 | Ht 69.0 in | Wt 218.0 lb

## 2016-08-11 DIAGNOSIS — O00101 Right tubal pregnancy without intrauterine pregnancy: Secondary | ICD-10-CM

## 2016-08-11 DIAGNOSIS — E349 Endocrine disorder, unspecified: Secondary | ICD-10-CM

## 2016-08-11 DIAGNOSIS — R1030 Lower abdominal pain, unspecified: Secondary | ICD-10-CM

## 2016-08-11 DIAGNOSIS — N83201 Unspecified ovarian cyst, right side: Secondary | ICD-10-CM

## 2016-08-11 DIAGNOSIS — N838 Other noninflammatory disorders of ovary, fallopian tube and broad ligament: Secondary | ICD-10-CM

## 2016-08-11 LAB — ABO/RH: ABO/RH(D): A POS

## 2016-08-11 NOTE — ED Provider Notes (Signed)
[facility] Emergency Department Provider Note  ____________________________________________   First MD Initiated Contact with Patient 08/11/16 0143     (approximate)  I have reviewed the triage vital signs and the nursing notes.   HISTORY  Chief Complaint Vaginal Bleeding  The patient and/or family speak(s) Spanish.  They understand they have the right to the use of a hospital interpreter, however at this time they prefer to speak directly with me in Spanish.  They know that they can ask for an interpreter at any time.   HPI Jillane Hilbert Bible is a 26 y.o. female G2P1001 with LMP in February who presents for 3 weeks of persistent right lower quadrant abdominal pain and vaginal bleeding.  She was seen by Dr. Jean Rosenthal at [facility] on April 7, approximately 3 weeks ago, and followed up at [facility] in [place] for probable ectopic pregnancy.  She received methotrexate and had a follow-up a couple of days later that showed a slightly decreasing hCG.  She has had no follow-up since then but she has had persistent moderate right lower quadrant aching abdominal pain with occasional cramps.  She has also had persistent vaginal bleeding and she says that she sometimes changes 7 or 8 pads per day.  She denies any lightheadedness, dizziness, fever/chills, chest pain, shortness of breath, nausea, vomiting, dysuria.  Nothing in particular makes the pain worse or better and it is constant.   No past medical history on file.  Patient Active Problem List   Diagnosis Date Noted  . Threatened abortion 07/18/2016  . Ectopic pregnancy 07/18/2016    Past Surgical History:  Procedure Laterality Date  . CESAREAN SECTION      Prior to Admission medications   Medication Sig Start Date End Date Taking? Authorizing Provider  acetaminophen (TYLENOL) 500 MG tablet Take 1,000 mg by mouth every 6 (six) hours as needed for mild pain, moderate pain, fever or headache.     Historical Provider, MD    Allergies Patient has no known allergies.  Family History  Problem Relation Age of Onset  . Hypertension Mother   . Diabetes Mother     Social History Social History  Substance Use Topics  . Smoking status: Never Smoker  . Smokeless tobacco: Never Used  . Alcohol use Yes    Review of Systems Constitutional: No fever/chills Eyes: No visual changes. ENT: No sore throat. Cardiovascular: Denies chest pain. Respiratory: Denies shortness of breath. Gastrointestinal: Right lower quadrant abdominal pain 3 weeks after methotrexate treatment for probable ectopic pregnancy Genitourinary: Persistent vaginal bleeding for 3 weeks, as much as 7 or 8 pads per day. Negative for dysuria. Musculoskeletal: Negative for back pain. Integumentary: Negative for rash. Neurological: Negative for headaches, focal weakness or numbness.   ____________________________________________   PHYSICAL EXAM:  VITAL SIGNS: ED Triage Vitals  Enc Vitals Group     BP 08/10/16 2207 123/68     Pulse Rate 08/10/16 2207 74     Resp 08/10/16 2207 18     Temp 08/10/16 2207 98.7 F (37.1 C)     Temp Source 08/10/16 2207 Oral     SpO2 08/10/16 2207 100 %     Weight 08/10/16 2207 219 lb (99.3 kg)     Height 08/10/16 2207  (1.753 m)     Head Circumference --      Peak Flow --      Pain Score 08/10/16 2210 6     Pain Loc --  Pain Edu? --      Excl. in GC? --     Constitutional: Alert and oriented. Well appearing and in no acute distress. Eyes: Conjunctivae are normal. PERRL. EOMI. Head: Atraumatic. Nose: No congestion/rhinnorhea. Mouth/Throat: Mucous membranes are moist. Neck: No stridor.  No meningeal signs.   Cardiovascular: Normal rate, regular rhythm. Good peripheral circulation. Grossly normal heart sounds. Respiratory: Normal respiratory effort.  No retractions. Lungs CTAB. Gastrointestinal: Soft With mild-to-moderate tenderness to palpation of the right lower  quadrant.  No rebound or guarding.  No generalized peritonitis. Genitourinary: Deferred due to shared decision making between provider and patient since she will follow up with OB/GYN later today or tomorrow Musculoskeletal: No lower extremity tenderness nor edema. No gross deformities of extremities. Neurologic:  Normal speech and language. No gross focal neurologic deficits are appreciated.  Skin:  Skin is warm, dry and intact. No rash noted. Psychiatric: Mood and affect are normal. Speech and behavior are normal.  ____________________________________________   LABS (all labs ordered are listed, but only abnormal results are displayed)  Labs Reviewed  COMPREHENSIVE METABOLIC PANEL - Abnormal; Notable for the following:       Result Value   Total Bilirubin 0.2 (*)    All other components within normal limits  URINALYSIS, ROUTINE W REFLEX MICROSCOPIC - Abnormal; Notable for the following:    Color, Urine YELLOW (*)    APPearance CLEAR (*)    Hgb urine dipstick MODERATE (*)    Squamous Epithelial / LPF 0-5 (*)    All other components within normal limits  HCG, QUANTITATIVE, PREGNANCY  CBC  ABO/RH   ____________________________________________  EKG  None - EKG not ordered by ED physician ____________________________________________  RADIOLOGY   US Ob Comp < 14 Wks  Result Date: 08/11/2016 CLINICAL DATA:  Pelvic pain, HCG is less than 3 EXAM: OBSTETRIC <14 WK [place] AND TRANSVAGINAL OB US TECHNIQUE: Both transabdominal and transvaginal ultrasound examinations were performed for complete evaluation of the gestation as well as the maternal uterus, adnexal regions, and pelvic cul-de-sac. Transvaginal technique was performed to assess early pregnancy. COMPARISON:  07/19/2016, 07/08/2016 FINDINGS: Intrauterine gestational sac: Not visualized Yolk sac:  Not visualized Embryo:  Not visualized Subchorionic hemorrhage:  None visualized. Maternal uterus/adnexae: Left ovary measures 3.5 x 2.1 x  2.1 cm. The right ovary measures 2.9 x 1.9 x 2.6 cm. Adjacent to the right ovary is a complex heterogenous hypoechoic mass measuring 7.3 x 2.8 x 5 cm, stable to slightly decreased in size in 2 dimensions. Endometrial thickness is 16 mm. No intrauterine gestational sac is visualized. IMPRESSION: Complex heterogenous hypoechoic mass measuring 7.3 cm adjacent to the right ovary. Differential considerations include complex material and/or hemorrhage within the right adnexa or fallopian tube, possibly related to the patient's history of treated ectopic. A tubal mass is felt unlikely given the young age of patient. If further imaging is required, MRI could be obtained. Electronically Signed   By: Jasmine Pang M.D.   On: 08/11/2016 03:28   US Ob Transvaginal  Result Date: 08/11/2016 CLINICAL DATA:  Pelvic pain, HCG is less than 3 EXAM: OBSTETRIC <14 WK [place] AND TRANSVAGINAL OB US TECHNIQUE: Both transabdominal and transvaginal ultrasound examinations were performed for complete evaluation of the gestation as well as the maternal uterus, adnexal regions, and pelvic cul-de-sac. Transvaginal technique was performed to assess early pregnancy. COMPARISON:  07/19/2016, 07/08/2016 FINDINGS: Intrauterine gestational sac: Not visualized Yolk sac:  Not visualized Embryo:  Not visualized Subchorionic hemorrhage:  None visualized. Maternal  uterus/adnexae: Left ovary measures 3.5 x 2.1 x 2.1 cm. The right ovary measures 2.9 x 1.9 x 2.6 cm. Adjacent to the right ovary is a complex heterogenous hypoechoic mass measuring 7.3 x 2.8 x 5 cm, stable to slightly decreased in size in 2 dimensions. Endometrial thickness is 16 mm. No intrauterine gestational sac is visualized. IMPRESSION: Complex heterogenous hypoechoic mass measuring 7.3 cm adjacent to the right ovary. Differential considerations include complex material and/or hemorrhage within the right adnexa or fallopian tube, possibly related to the patient's history of treated ectopic.  A tubal mass is felt unlikely given the young age of patient. If further imaging is required, MRI could be obtained. Electronically Signed   By: Jasmine Pang M.D.   On: 08/11/2016 03:28    ____________________________________________   PROCEDURES  Critical Care performed: No   Procedure(s) performed:   Procedures   ____________________________________________   INITIAL IMPRESSION / ASSESSMENT AND PLAN / ED COURSE  Pertinent labs & imaging results that were available during my care of the patient were reviewed by me and considered in my medical decision making (see chart for details).  The patient's lab work is stable with a normal hemoglobin and hematocrit, no evidence of infection, and an hCG of only 3.  I ordered a repeat ultrasound and it is essentially unchanged with a very large periovarian cyst of greater than 7 cm.  It is complex and worrisome.  However the patient has no severe tenderness to palpation and her symptoms of an constant for 3 weeks.  I will speak by phone with the OB/GYN to discuss the case and asked for management recommendations.   Clinical Course as of Aug 11 509  Mon Aug 11, 2016  0500 I spoke by phone with Dr. Greggory Keen with [facility] who is covering for [facility]  I explained the situation.  He felt that, given the patient's hemodynamic stability and relatively chronic nature of her symptoms and stable hemoglobin, she needs urgent outpatient follow-up but does not need to be seen or admitted to the hospital tonight.  The clinical open in the next 3 hours and I explained to the patient that she should call and explain the situation and asked for an appointment today if at all possible.  Dr. Greggory Keen agreed that she will likely need operative Surgery to further evaluate this mass.  However her pain is unchanged over the last 3 weeks and she is hemodynamically stable with a normal hemoglobin.  I offered to perform pelvic exam but the patient would  rather wait and follow up later today or tomorrow if necessary in clinic and I think that is appropriate because it is unlikely any findings on pelvic exam would change our plan.  She is comfortable with the plan for discharge and outpatient follow-up.  I gave strict return precautions.  [CF]  [phone] Of note, the radiologist did not comment on any significant pelvic free fluid, and the patient has only localized tenderness to palpation with no generalized peritonitis.  [CF]    Clinical Course User Index [CF] Loleta Rose, MD    ____________________________________________  FINAL CLINICAL IMPRESSION(S) / ED DIAGNOSES  Final diagnoses:  Right ovarian cyst  Elevated serum hCG     MEDICATIONS GIVEN DURING THIS VISIT:  Medications - No data to display   NEW OUTPATIENT MEDICATIONS STARTED DURING THIS VISIT:  New Prescriptions   No medications on file    Modified Medications   No medications on file    Discontinued Medications  No medications on file     Note:  This document was prepared using Dragon voice recognition software and may include unintentional dictation errors.    Loleta Rose, MD 08/11/16 [phone]

## 2016-08-11 NOTE — Discharge Instructions (Signed)
Your labs were reassuring today, and the OB/GYN doctor feels that you do not stay in the hospital but need to follow up in clinic as soon as possible.  He will likely need to have a surgical procedure to look at the ovarian cyst and possibly remove it.  Please call from the clinic opens at about 8:00 AM and explain the situation and that the emergency department doctor and OB/GYN doctor said that you need to follow up today if at all possible and that you will likely need surgery because you received methotrexate about 3 weeks ago and it has not worked.  If you develop any new or worsening symptoms, please return immediately to the emergency department.

## 2016-08-11 NOTE — Progress Notes (Signed)
Obstetrics & Gynecology Office Visit   Chief Complaint  Patient presents with  . ER Follow up    History of Present Illness: 26 y.o. G47P1011 female with recent presumptive diagnosis of ectopic pregnancy, status post methotrexate at [facility].  She did not keep good follow up. She was seen in the ER last night for bleeding. She states that she had stopped bleeding for two weeks and it restarted on Saturday. Sometimes it is heavy and other light. She also has had pain in her suprapubic area during this time that is moderate and pulsating.  She is able to treat the pain at home.  However, due to her recent history and lack of follow up she wanted to verify that everything was fine.  She had an HCG at [facility] that was 3 (previously in the 22s).  She had an ultrasound that showed a 7.3 x 2.8 x 5cm complex heterogeneous hypoechoic mass that was next to the right ovary.  It is slightly decreased in size from her previous ultrasound. Denies fevers, chills.  Rh is positive. She was hemodynamically stable in the ER with an increased hemoglobin/hematocrit.  Review of Systems  Constitutional: Negative.   HENT: Negative.   Eyes: Negative.   Respiratory: Negative.   Cardiovascular: Negative.   Gastrointestinal: Negative.        See HPI  Genitourinary:       See HPI  Musculoskeletal: Negative.   Skin: Negative.   Neurological: Negative.   Psychiatric/Behavioral: Negative.    Past Medical History: Ectopic pregnancy    Past Surgical History:  Procedure Laterality Date  . CESAREAN SECTION     Gynecologic History: Patient's last menstrual period was 06/01/2016 (exact date).  Obstetric History: G2P1011  Family History  Problem Relation Age of Onset  . Hypertension Mother   . Diabetes Mother     Social History   Social History  . Marital status: Married    Spouse name: N/A  . Number of children: N/A  . Years of education: N/A   Occupational History  . Not on file.   Social  History Main Topics  . Smoking status: Never Smoker  . Smokeless tobacco: Never Used  . Alcohol use Yes  . Drug use: No  . Sexual activity: Yes    Birth control/ protection: None   Other Topics Concern  . Not on file   Social History Narrative  . No narrative on file    Allergies: No Known Allergies  Medications:   Medication Sig Start Date End Date Taking? Authorizing Provider  acetaminophen (TYLENOL) 500 MG tablet Take 1,000 mg by mouth every 6 (six) hours as needed for mild pain, moderate pain, fever or headache.    Historical Provider, MD    Physical Exam BP 110/70   Pulse 69   Ht  (1.753 m)   Wt 218 lb (98.9 kg)   LMP 06/01/2016 (Exact Date)   SpO2 98%   BMI 32.19 kg/m  Patient's last menstrual period was 06/01/2016 (exact date). Physical Exam  Constitutional: She is well-developed, well-nourished, and in no distress. No distress.  HENT:  Head: Normocephalic and atraumatic.  Eyes: Conjunctivae are normal. No scleral icterus.  Neck: Normal range of motion. Neck supple. No thyromegaly present.  Cardiovascular: Normal rate and regular rhythm.  Exam reveals no gallop and no friction rub.   No murmur heard. Pulmonary/Chest: Effort normal and breath sounds normal. No respiratory distress. She has no wheezes. She has no rales.  Abdominal:  Soft. Bowel sounds are normal. She exhibits no distension and no mass. There is tenderness (mild TTP, L>R suprapubic). There is no rebound and no guarding.  Genitourinary: Vagina normal, uterus normal, cervix normal and vulva normal. Right adnexum displays tenderness. Left adnexum displays tenderness.  Genitourinary Comments: Mass palpated on right (fullness), only applied small amount of pressure on either side, but L>R adnexal tenderness.   Lymphadenopathy:    She has no cervical adenopathy.    Female chaperone present for pelvic and breast  portions of the physical exam  Assessment: 26 y.o. G2P1011 right pelvic mass,  abdomino-pelvic pain.  Mass may be related to prior pregnancy, which appears to have resolved in response to methotrexate.  If is related, is likely to be reabsorbed.  If unrelated, to OR for management.   Plan: - plan surgical removal in 6 weeks. - ultrasound just prior to that to make sure there is no change. If smaller, will not perform surgery. - gonorrhea/chlamydia today as she has not had this test prior.   Thomasene Mohair, MD 08/11/2016 2:33 PM

## 2016-08-13 ENCOUNTER — Ambulatory Visit: Payer: Self-pay | Admitting: Obstetrics and Gynecology

## 2016-08-13 LAB — GC/CHLAMYDIA PROBE AMP
CHLAMYDIA, DNA PROBE: NEGATIVE
NEISSERIA GONORRHOEAE BY PCR: NEGATIVE

## 2016-08-26 ENCOUNTER — Telehealth: Payer: Self-pay | Admitting: Obstetrics and Gynecology

## 2016-08-26 NOTE — Telephone Encounter (Signed)
I attempted to contact the patient via [employer], [place], [place] [phone] but there was no answer, no v/m.

## 2016-08-26 NOTE — Telephone Encounter (Signed)
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

## 2016-09-01 NOTE — Telephone Encounter (Signed)
I attempted to contact the patient via [employer], [place], [place] [phone] but there was no answer, no v/m.

## 2016-09-05 NOTE — Telephone Encounter (Signed)
I attempted to contact the patient via [employer], [place], [place]# [phone] but there was no answer, no v/m.

## 2016-09-09 NOTE — Telephone Encounter (Signed)
I attempted to contact the patient via [employer], [place], [place] [phone] but there was no answer, no v/m.

## 2016-09-10 ENCOUNTER — Encounter: Payer: Medicaid Other | Admitting: Obstetrics and Gynecology

## 2016-09-10 ENCOUNTER — Other Ambulatory Visit: Payer: Medicaid Other

## 2016-09-29 ENCOUNTER — Inpatient Hospital Stay: Admission: RE | Admit: 2016-09-29 | Payer: Self-pay | Source: Ambulatory Visit

## 2016-09-29 ENCOUNTER — Ambulatory Visit (INDEPENDENT_AMBULATORY_CARE_PROVIDER_SITE_OTHER): Payer: Self-pay | Admitting: Obstetrics and Gynecology

## 2016-09-29 ENCOUNTER — Encounter: Payer: Self-pay | Admitting: Obstetrics and Gynecology

## 2016-09-29 ENCOUNTER — Ambulatory Visit (INDEPENDENT_AMBULATORY_CARE_PROVIDER_SITE_OTHER): Payer: Self-pay

## 2016-09-29 VITALS — BP 124/78 | Ht 69.0 in | Wt 223.0 lb

## 2016-09-29 DIAGNOSIS — R1030 Lower abdominal pain, unspecified: Secondary | ICD-10-CM

## 2016-09-29 DIAGNOSIS — O00101 Right tubal pregnancy without intrauterine pregnancy: Secondary | ICD-10-CM

## 2016-09-29 DIAGNOSIS — N838 Other noninflammatory disorders of ovary, fallopian tube and broad ligament: Secondary | ICD-10-CM

## 2016-09-29 LAB — POCT URINE PREGNANCY: PREG TEST UR: NEGATIVE

## 2016-09-29 NOTE — Addendum Note (Signed)
Addended by: Liliane ShiSHAW, BEVERLY M on: 09/29/2016 10:15 AM   Modules accepted: Orders

## 2016-09-29 NOTE — Progress Notes (Signed)
   Gynecology Ultrasound Follow Up   Chief Complaint  Patient presents with  . Follow-up    U/S Follow Up  . H&P for surgery  Right adnexal mass   History of Present Illness: Patient is a 26 y.o. female who presents today for ultrasound evaluation of right adnexal mass in setting of recent ectopic pregnancy.  Ultrasound demonstrates the following findngs Adnexa: no masses or fluid collections seen (resolved from prior) Uterus: anteverted with endometrial stripe  6.4 mm Additional: normal scan  Review of Systems: ROS  Past Medical History:  Diagnosis Date  . Ectopic pregnancy     Past Surgical History:  Procedure Laterality Date  . CESAREAN SECTION      Gynecologic History: LMP early May  Family History  Problem Relation Age of Onset  . Hypertension Mother   . Diabetes Mother     Social History   Social History  . Marital status: Married    Spouse name: N/A  . Number of children: N/A  . Years of education: N/A   Occupational History  . Not on file.   Social History Main Topics  . Smoking status: Never Smoker  . Smokeless tobacco: Never Used  . Alcohol use Yes  . Drug use: No  . Sexual activity: Yes    Birth control/ protection: None   Other Topics Concern  . Not on file   Social History Narrative  . No narrative on file    Allergies: No Known Allergies  Medications:   Medication Sig Start Date End Date Taking? Authorizing Provider  acetaminophen (TYLENOL) 500 MG tablet Take 1,000 mg by mouth every 6 (six) hours as needed for mild pain, moderate pain, fever or headache.    [provider]    Physical Exam Vitals: Blood pressure 124/78, height 5\' 9"  (1.753 m), weight 223 lb (101.2 kg).  General: NAD HEENT: normocephalic, anicteric Pulmonary: No increased work of breathing Extremities: no edema, erythema, or tenderness Neurologic: Grossly intact, normal gait Psychiatric: mood appropriate, affect full  POCT: urine pregnancy:  negative  Assessment: 26 y.o. G2P1011 status post ectopic pregnancy, resolved. Right adnexal mass, resolved.    Plan: 1) cancel surgery 2) Pregnancy test - negative 3) recommend contraception, if not wanting to get pregnant. 4) when she does get pregnant, get into care early due to history of ectopic  5) recommend PNV and routine preventative care  15 minutes spent in face to face discussion with > 50% spent in counseling and management of her right adnexal mass and ectopic pregnancy history.   Thomasene MohairStephen Kamarah Bilotta, MD 09/29/2016 10:03 AM

## 2016-10-02 ENCOUNTER — Ambulatory Visit: Admission: RE | Admit: 2016-10-02 | Payer: Self-pay | Source: Ambulatory Visit | Admitting: Obstetrics and Gynecology

## 2016-10-02 ENCOUNTER — Encounter: Admission: RE | Payer: Self-pay | Source: Ambulatory Visit

## 2016-10-02 SURGERY — LAPAROSCOPY OPERATIVE
Anesthesia: Choice

## 2017-06-25 ENCOUNTER — Encounter: Payer: Self-pay | Admitting: Emergency Medicine

## 2017-06-25 ENCOUNTER — Emergency Department
Admission: EM | Admit: 2017-06-25 | Discharge: 2017-06-25 | Disposition: A | Discharge status: Home / Self Care | Attending: Emergency Medicine | Admitting: Emergency Medicine

## 2017-06-25 ENCOUNTER — Other Ambulatory Visit: Payer: Self-pay

## 2017-06-25 DIAGNOSIS — J029 Acute pharyngitis, unspecified: Secondary | ICD-10-CM | POA: Diagnosis present

## 2017-06-25 DIAGNOSIS — R05 Cough: Secondary | ICD-10-CM | POA: Diagnosis not present

## 2017-06-25 DIAGNOSIS — B9789 Other viral agents as the cause of diseases classified elsewhere: Secondary | ICD-10-CM

## 2017-06-25 DIAGNOSIS — J069 Acute upper respiratory infection, unspecified: Secondary | ICD-10-CM | POA: Diagnosis not present

## 2017-06-25 MED ORDER — PSEUDOEPH-BROMPHEN-DM 30-2-10 MG/5ML PO SYRP: 5.0000 mL | ORAL_SOLUTION | Freq: Four times a day (QID) | ORAL | 0 refills | Status: AC | PRN

## 2017-06-25 NOTE — ED Triage Notes (Signed)
Pt presents to ED c/o body aches, non-productive cough, sore throat, and fever x2 days. Has been taking thera-flu.

## 2017-06-25 NOTE — ED Notes (Signed)
See triage note.  States she developed body aches ,cough and subjective fever for the past 2-3 days  States cough is non prod  Also having some sinus pressure  Afebrile on arrival

## 2017-06-25 NOTE — ED Provider Notes (Signed)
[facility] Emergency Department Provider Note  ____________________________________________   First MD Initiated Contact with Patient 06/25/17 1633     (approximate)  I have reviewed the triage vital signs and the nursing notes.   HISTORY  Chief Complaint Influenza   HPI Adrienne Santos is a 27 y.o. female is here with complaint of body aches, sore throat, nonproductive cough and fever for 2 days.  Patient has been taking TheraFlu without any improvement.  She states that the over-the-counter medication has not helped with her congestion.  She has not actually taken her temperature and denies any chills.  There is been no vomiting or diarrhea.  No other family members are sick at this time.   Past Medical History:  Diagnosis Date  . Ectopic pregnancy     Patient Active Problem List   Diagnosis Date Noted  . Complex cyst of uterine adnexa 08/11/2016  . Lower abdominal pain 08/11/2016  . Threatened abortion 07/18/2016  . Ectopic pregnancy 07/18/2016    Past Surgical History:  Procedure Laterality Date  . CESAREAN SECTION      Prior to Admission medications   Medication Sig Start Date End Date Taking? Authorizing Provider  acetaminophen (TYLENOL) 500 MG tablet Take 1,000 mg by mouth every 6 (six) hours as needed for mild pain, moderate pain, fever or headache.    [provider]  brompheniramine-pseudoephedrine-DM 30-2-10 MG/5ML syrup Take 5 mLs by mouth 4 (four) times daily as needed. 06/25/17   Tommi RumpsSummers, Kamsiyochukwu Buist L, PA-C    Allergies Patient has no known allergies.  Family History  Problem Relation Age of Onset  . Hypertension Mother   . Diabetes Mother     Social History Social History   Tobacco Use  . Smoking status: Never Smoker  . Smokeless tobacco: Never Used  Substance Use Topics  . Alcohol use: Yes  . Drug use: No    Review of Systems Constitutional: Subjective fever/chills Eyes: No visual changes. ENT:  Positive sore throat. Cardiovascular: Denies chest pain. Respiratory: Denies shortness of breath.  Positive nonproductive cough. Gastrointestinal: No abdominal pain.  No nausea, no vomiting.  No diarrhea.   Genitourinary: Negative for dysuria. Musculoskeletal: Negative for body aches. Skin: Negative for rash. Neurological: Negative for headaches, focal weakness or numbness. ____________________________________________   PHYSICAL EXAM:  VITAL SIGNS: ED Triage Vitals  Enc Vitals Group     BP 06/25/17 1617 128/76     Pulse Rate 06/25/17 1617 71     Resp 06/25/17 1617 16     Temp 06/25/17 1617 98.5 F (36.9 C)     Temp Source 06/25/17 1617 Oral     SpO2 06/25/17 1617 100 %     Weight 06/25/17 1621 214 lb (97.1 kg)     Height 06/25/17 1621 5\' 9"  (1.753 m)     Head Circumference --      Peak Flow --      Pain Score 06/25/17 1621 5     Pain Loc --      Pain Edu? --      Excl. in GC? --    Constitutional: Alert and oriented. Well appearing and in no acute distress.  Eyes: Conjunctivae are normal.  Head: Atraumatic. Nose: Moderate congestion/rhinnorhea.  TMs are dull bilaterally. Mouth/Throat: Mucous membranes are moist.  Oropharynx non-erythematous.  Moderate posterior drainage noted. Neck: No stridor.   Hematological/Lymphatic/Immunilogical: No cervical lymphadenopathy. Cardiovascular: Normal rate, regular rhythm. Grossly normal heart sounds.  Good peripheral circulation. Respiratory: Normal respiratory effort.  No retractions. Lungs CTAB.  Congested cough is noted. Gastrointestinal: Soft and nontender. No distention.  No CVA tenderness.  Bowel sounds normoactive x4 quadrants. Musculoskeletal: Moves upper and lower extremities without any difficulty. Neurologic:  Normal speech and language. No gross focal neurologic deficits are appreciated.  Skin:  Skin is warm, dry and intact. No rash noted. Psychiatric: Mood and affect are normal. Speech and behavior are  normal.  ____________________________________________   LABS (all labs ordered are listed, but only abnormal results are displayed)  Labs Reviewed - No data to display   ____________________________________________   PROCEDURES  Procedure(s) performed: None  Procedures  Critical Care performed: No  ____________________________________________   INITIAL IMPRESSION / ASSESSMENT AND PLAN / ED COURSE  Patient will discontinue taking the over-the-counter medication that she has been taking since it does not seem to be helping with her cough.  She was given a prescription for Bromfed-DM as needed for cough and congestion.  She is encouraged to drink fluids and take Tylenol or ibuprofen as needed for sore throat and fever.  ____________________________________________   FINAL CLINICAL IMPRESSION(S) / ED DIAGNOSES  Final diagnoses:  Viral URI with cough     ED Discharge Orders        Ordered    brompheniramine-pseudoephedrine-DM 30-2-10 MG/5ML syrup  4 times daily PRN     06/25/17 1640       Note:  This document was prepared using Dragon voice recognition software and may include unintentional dictation errors.    Tommi Rumps, PA-C 06/26/17 0112    Merrily Brittle, MD 06/26/17 2337

## 2018-10-12 IMAGING — US OB
1 series · 13 of 16 positions shown · non-contrast
Comparison: 07/19/2016, 07/08/2016

CLINICAL DATA: Pelvic pain, HCG is less than 3

EXAM:
OBSTETRIC <14 WK US AND TRANSVAGINAL OB US
TECHNIQUE: Both transabdominal and transvaginal ultrasound examinations were
performed for complete evaluation of the gestation as well as the
maternal uterus, adnexal regions, and pelvic cul-de-sac.
Transvaginal technique was performed to assess early pregnancy.

[Series 1: ob · 0.22mm/px · 13 of 101 slices shown]
[im 1/101]
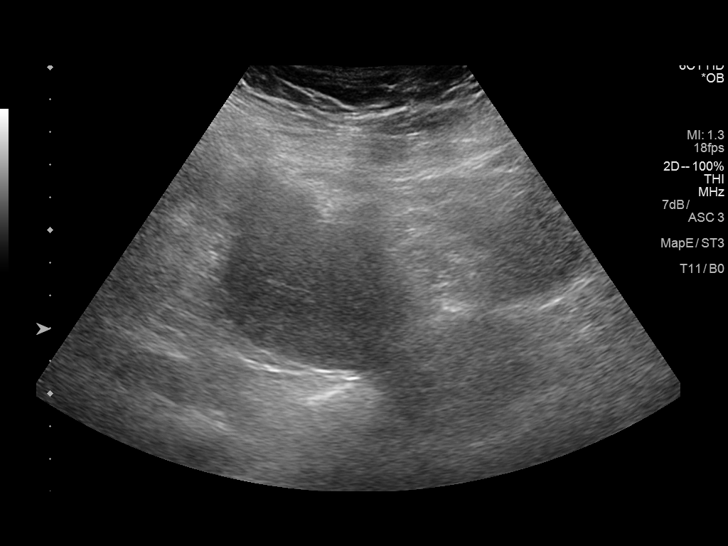
[im 7/101]
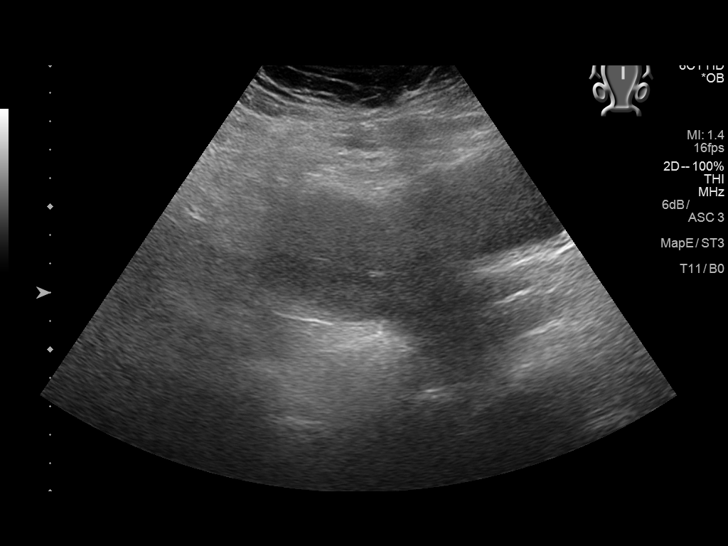
[im 21/101]
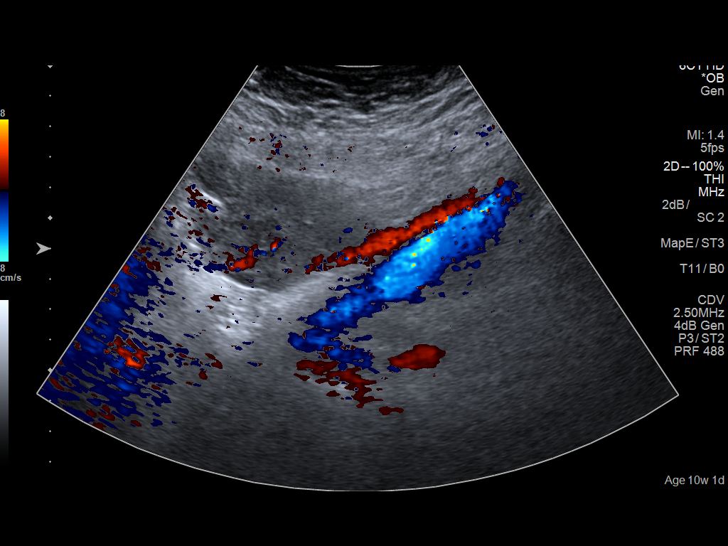
[im 27/101]
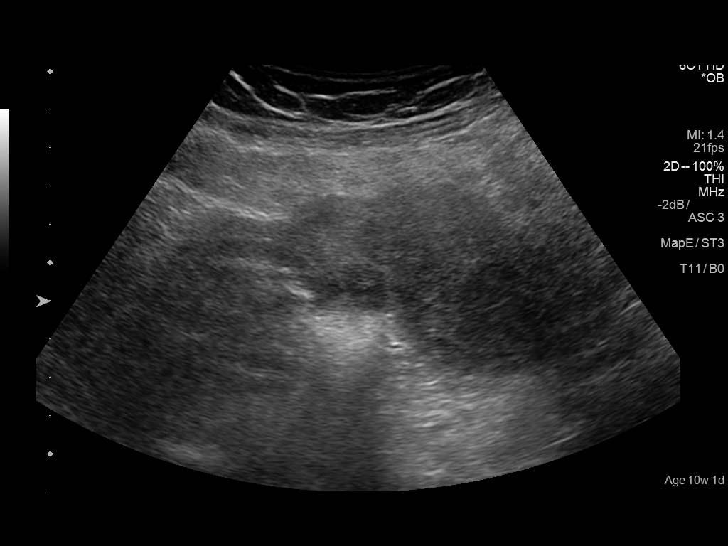
[im 34/101]
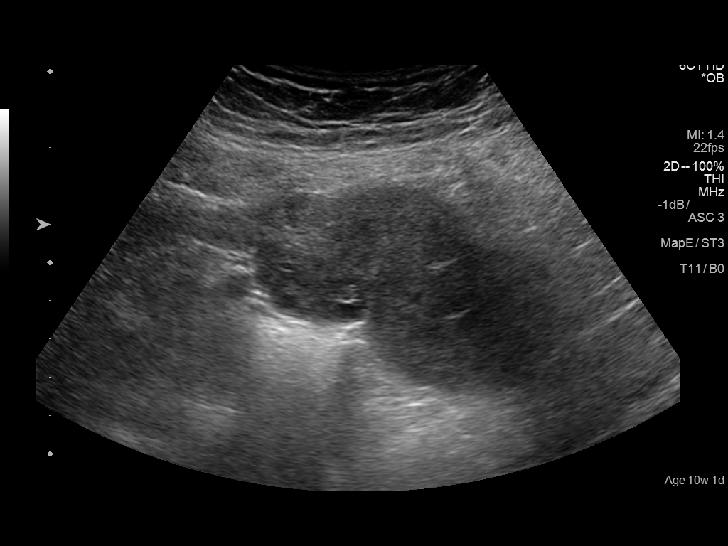
[im 41/101]
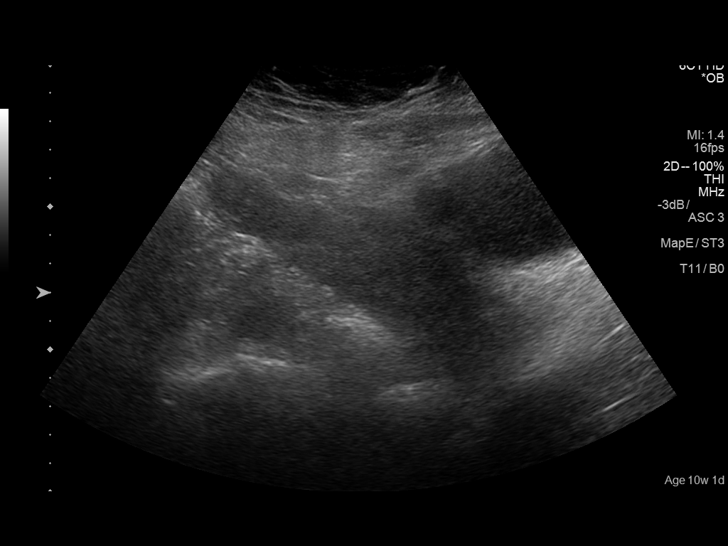
[im 54/101]
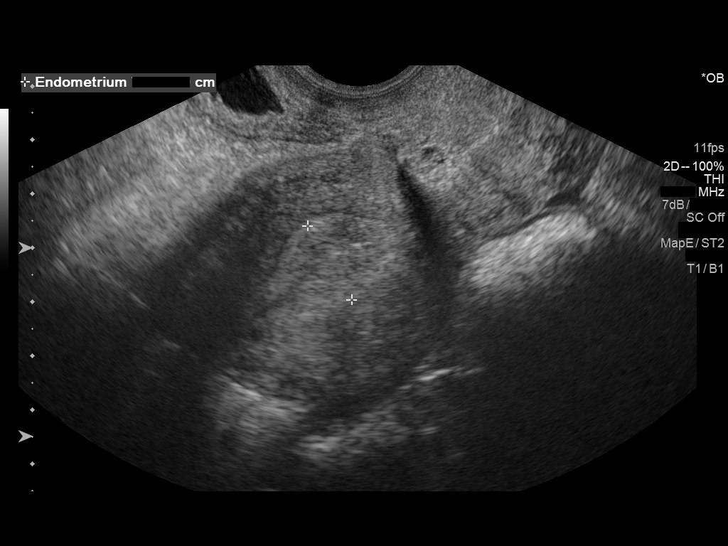
[im 61/101]
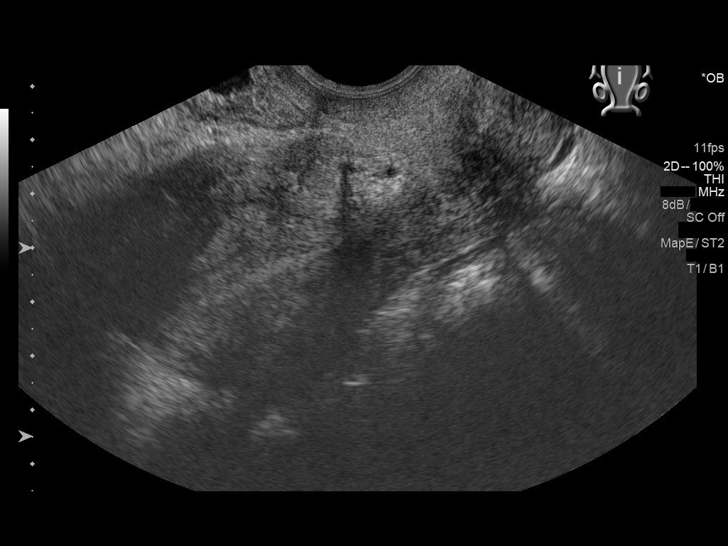
[im 67/101]
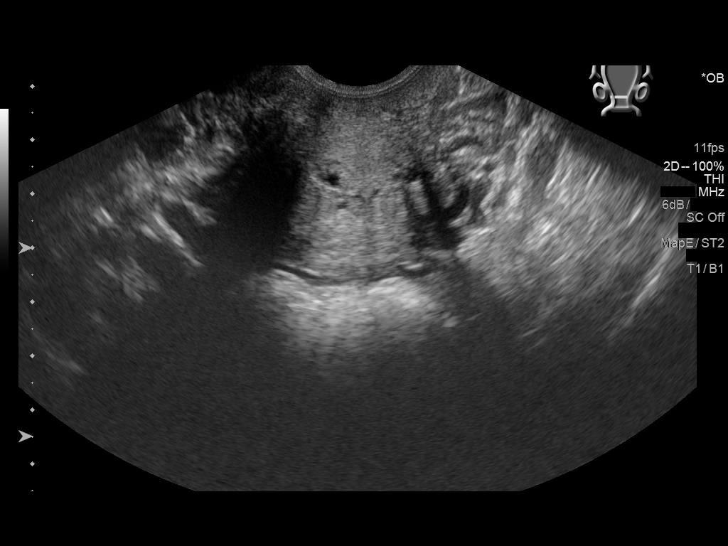
[im 74/101]
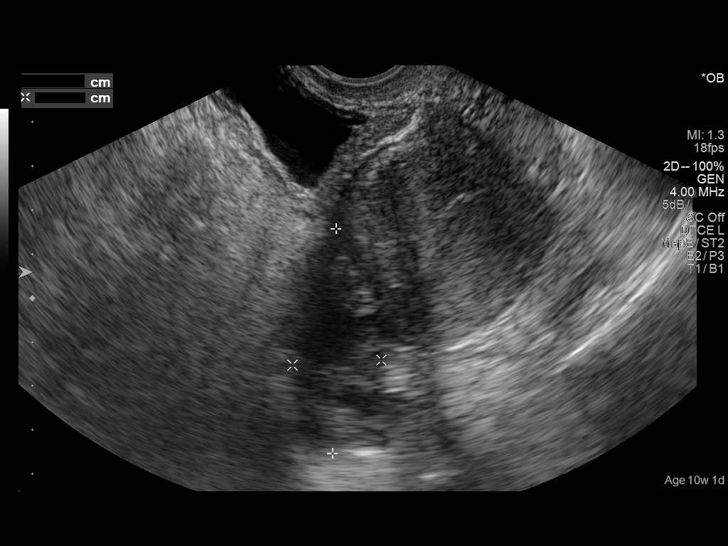
[im 81/101]
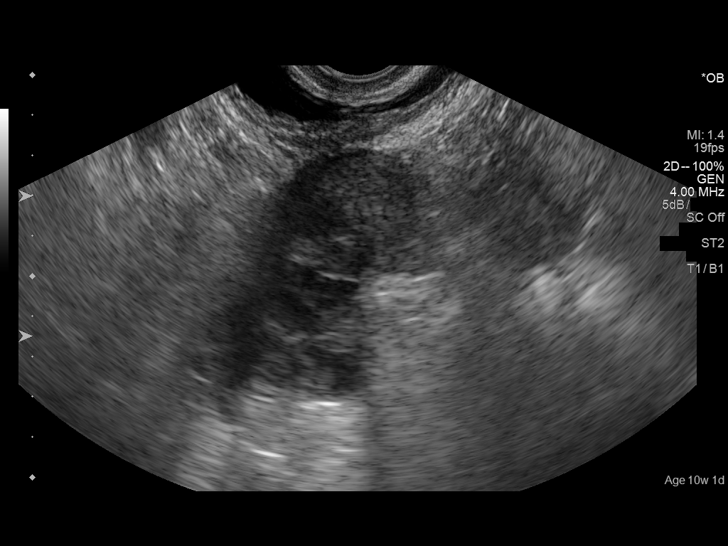
[im 94/101]
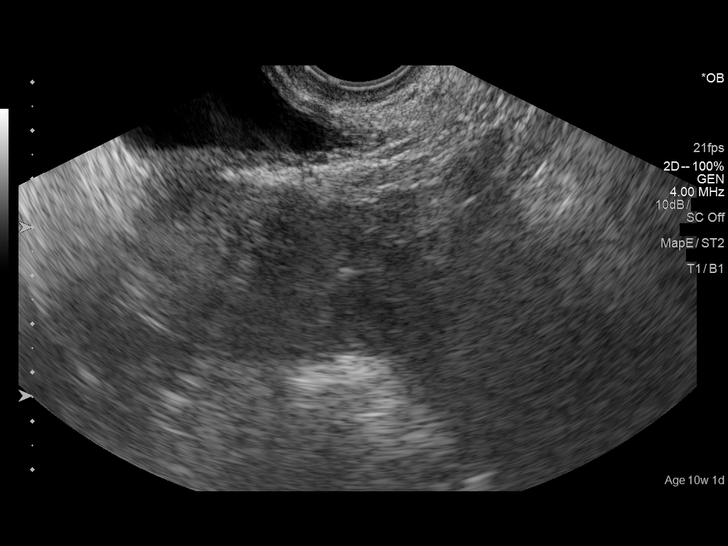
[im 101/101]
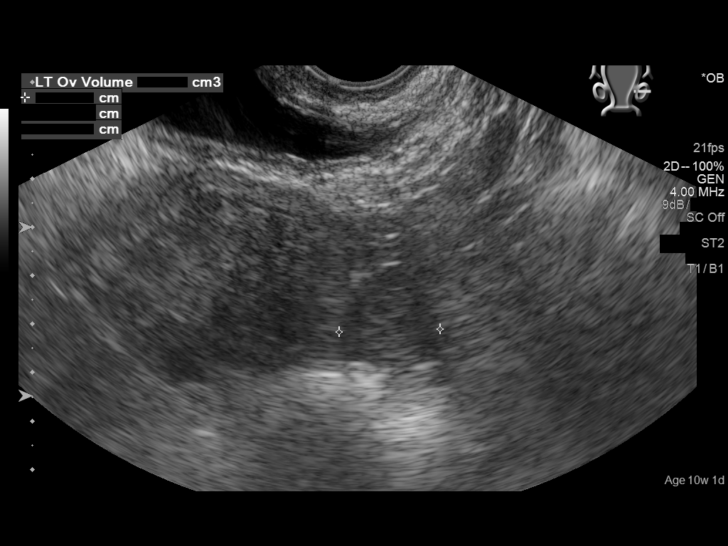

[13 of 16 positions shown; findings below may reference images not displayed]

FINDINGS: Intrauterine gestational sac: Not visualized

Yolk sac:  Not visualized

Embryo:  Not visualized

Subchorionic hemorrhage:  None visualized.

Maternal uterus/adnexae: Left ovary measures 3.5 x 2.1 x 2.1 cm. The
right ovary measures 2.9 x 1.9 x 2.6 cm. Adjacent to the right ovary
is a complex heterogenous hypoechoic mass measuring 7.3 x 2.8 x 5
cm, stable to slightly decreased in size in 2 dimensions.
Endometrial thickness is 16 mm. No intrauterine gestational sac is
visualized.
IMPRESSION: Complex heterogenous hypoechoic mass measuring 7.3 cm adjacent to
the right ovary. Differential considerations include complex
material and/or hemorrhage within the right adnexa or fallopian
tube, possibly related to the patient's history of treated ectopic.
A tubal mass is felt unlikely given the young age of patient. If
further imaging is required, MRI could be obtained.

## 2019-01-14 ENCOUNTER — Other Ambulatory Visit: Payer: Self-pay

## 2019-01-14 DIAGNOSIS — Z20822 Contact with and (suspected) exposure to covid-19: Secondary | ICD-10-CM

## 2019-01-15 LAB — NOVEL CORONAVIRUS, NAA: SARS-CoV-2, NAA: NOT DETECTED

## 2019-04-21 ENCOUNTER — Ambulatory Visit: Attending: Internal Medicine

## 2019-04-21 DIAGNOSIS — Z20822 Contact with and (suspected) exposure to covid-19: Secondary | ICD-10-CM

## 2019-04-23 LAB — NOVEL CORONAVIRUS, NAA: SARS-CoV-2, NAA: NOT DETECTED

## 2019-07-08 ENCOUNTER — Ambulatory Visit: Attending: Internal Medicine

## 2019-07-08 DIAGNOSIS — Z23 Encounter for immunization: Secondary | ICD-10-CM

## 2019-07-08 NOTE — Progress Notes (Signed)
   Covid-19 Vaccination Clinic  Name:  Tanisia Yokley    MRN: [id number] DOB: 1991-04-06  07/08/2019  Adrienne Santos was observed post Covid-19 immunization for 15 minutes without incident. She was provided with Vaccine Information Sheet and instruction to access the V-Safe system.   Adrienne Santos was instructed to call 911 with any severe reactions post vaccine: Marland Kitchen Difficulty breathing  . Swelling of face and throat  . A fast heartbeat  . A bad rash all over body  . Dizziness and weakness   Immunizations Administered    Name Date Dose VIS Date Route   Pfizer COVID-19 Vaccine 07/08/2019  1:32 PM 0.3 mL 03/25/2019 Intramuscular   Manufacturer: [employer], [employer]   Lot: [id number]   NDC: [address]-5

## 2019-07-29 ENCOUNTER — Ambulatory Visit: Attending: Internal Medicine

## 2019-07-29 DIAGNOSIS — Z23 Encounter for immunization: Secondary | ICD-10-CM

## 2019-07-29 NOTE — Progress Notes (Signed)
   Covid-19 Vaccination Clinic  Name:  Adrienne Santos    MRN: [id number] DOB: 1990-06-24  07/29/2019  [name] was observed post Covid-19 immunization for 15 minutes without incident. She was provided with Vaccine Information Sheet and instruction to access the V-Safe system.   [name] was instructed to call 911 with any severe reactions post vaccine: Marland Kitchen Difficulty breathing  . Swelling of face and throat  . A fast heartbeat  . A bad rash all over body  . Dizziness and weakness   Immunizations Administered    Name Date Dose VIS Date Route   Pfizer COVID-19 Vaccine 07/29/2019 12:27 PM 0.3 mL 03/25/2019 Intramuscular   Manufacturer: [employer], [employer]   Lot: [id number]   NDC: [address]-2

## 2020-01-11 ENCOUNTER — Other Ambulatory Visit (INDEPENDENT_AMBULATORY_CARE_PROVIDER_SITE_OTHER): Payer: TRICARE Extra—PPO | Attending: Acute Care

## 2020-01-11 ENCOUNTER — Encounter (INDEPENDENT_AMBULATORY_CARE_PROVIDER_SITE_OTHER): Payer: TRICARE Extra—PPO | Admitting: Physician Assistant

## 2020-01-11 DIAGNOSIS — Z1159 Encounter for screening for other viral diseases: Secondary | ICD-10-CM | POA: Insufficient documentation

## 2020-01-12 LAB — COVID-19 DETECTION ASSAY ASYM: COVID-19 Coronavirus Result: NOT DETECTED

## 2020-03-20 ENCOUNTER — Telehealth (INDEPENDENT_AMBULATORY_CARE_PROVIDER_SITE_OTHER): Payer: Self-pay | Admitting: Family Practice

## 2020-03-20 NOTE — Telephone Encounter (Signed)
New Patient Pre-Visit Planning    Mychart offered: Yes    Establish Visit Provider: Dr. Dionicia Abler    Establish Visit Date: 07/23/2020    Agenda Setting:    1) Establish Care   2) Review Medical History   3) PE w/papsmear    Preferred Pharmacy:    Send to:     [facility] [address] [place] - [address]  [address]  [place] [address]  Phone: [phone] Fax: [phone]        Previous Medical Provider/Facility:    Name/Facility: none     Route to appropriate department nurse pool based on PCP type ie: attending or resident.

## 2020-07-23 ENCOUNTER — Ambulatory Visit (INDEPENDENT_AMBULATORY_CARE_PROVIDER_SITE_OTHER): Payer: TRICARE Extra—PPO | Admitting: Family Practice

## 2020-07-23 ENCOUNTER — Encounter (INDEPENDENT_AMBULATORY_CARE_PROVIDER_SITE_OTHER): Payer: Self-pay | Admitting: Family Practice

## 2020-07-23 ENCOUNTER — Other Ambulatory Visit: Payer: Self-pay

## 2020-07-23 VITALS — BP 114/69 | HR 67 | Temp 98.0°F | Ht 69.0 in | Wt 243.0 lb

## 2020-07-23 DIAGNOSIS — Z131 Encounter for screening for diabetes mellitus: Secondary | ICD-10-CM

## 2020-07-23 DIAGNOSIS — Z1159 Encounter for screening for other viral diseases: Secondary | ICD-10-CM

## 2020-07-23 DIAGNOSIS — E669 Obesity, unspecified: Secondary | ICD-10-CM

## 2020-07-23 DIAGNOSIS — Z13220 Encounter for screening for lipoid disorders: Secondary | ICD-10-CM

## 2020-07-23 DIAGNOSIS — Z23 Encounter for immunization: Secondary | ICD-10-CM

## 2020-07-23 DIAGNOSIS — N979 Female infertility, unspecified: Secondary | ICD-10-CM

## 2020-07-23 DIAGNOSIS — R7303 Prediabetes: Secondary | ICD-10-CM | POA: Insufficient documentation

## 2020-07-23 DIAGNOSIS — R102 Pelvic and perineal pain: Secondary | ICD-10-CM

## 2020-07-23 NOTE — Progress Notes (Signed)
Chief Complaint:  Establish Care      Subjective:    Linda Landry is a 30 year old female who presents with   Chief Complaint   Patient presents with    Establish Care       Patient here to establish care. No prior relevant records available for review. Prior care in [place].     #Ectopic pregnancy: 2017. Desires a pregnancy. Has one living child via csection 2015. Having trouble since and would like REI referral. Has a~28 years old patient was told she had cysts of the ovaries and that she would never had period. Hx of taking medications to make the period more regular. 17 day delay on menstrual cycle this last month, currently on her menses. No recent ultrasounds completed. Current partner is the same. Has heavy periods and painful.   #Hx of PP: Says her mood is "good."  Denies anxiety and stress.     #Prediabetes: Diagnosed prior.     #Weight: Has had a lot of changes with weight. Will use [employer] sometimes.     #HCM: Due for PAP.     Reviewed past medical history, surgical, family, medication and social history.   Reviewed outside medical records if available.     FM PHQ2 07/23/2020   PHQ2 Total 2     FM PHQ9 score 07/23/2020   PHQ9 Patient Summary Score (calculated) 12     GAD 7 07/23/2020   1. Feeling nervous, anxious or on edge 1   2. Not being able to stop or control worrying 0   3. Worrying too much about different things 1   4. Trouble relaxing 1   5. Being so restless that it is hard to sit still 0   6. Being easily annoyed or irritable 3   7. Feeling afraid as if something awful might happen 0   GAD7 Patient Total 6   If you checked off any problems, how difficult have these problems made it for you to do your job along with other people? Somewhat difficult         Review of Systems   Constitutional: Negative for fever.   HENT: Negative for sore throat.    Eyes: Negative for discharge.   Respiratory: Negative for shortness of breath.    Cardiovascular: Negative for chest pain.    Gastrointestinal: Negative for abdominal pain, constipation, diarrhea, nausea and vomiting.   Genitourinary: Positive for pelvic pain. Negative for dysuria.   Skin: Negative for rash.   Neurological: Negative for headaches.   Psychiatric/Behavioral: Negative for dysphoric mood. The patient is not nervous/anxious.            No current outpatient medications on file prior to visit.     No current facility-administered medications on file prior to visit.       No Known Allergies    Patient Active Problem List   Diagnosis    Pelvic pain    Prediabetes    Obesity (BMI 35.0-39.9 without comorbidity)    Female fertility problem       History reviewed. No pertinent past medical history.    Past Surgical History:   Procedure Laterality Date    CESAREAN SECTION, CLASSIC         Social History     Socioeconomic History    Marital status: Married     Spouse name: Not on file    Number of children: Not on file    Years of education:  Not on file    Highest education level: Not on file   Occupational History    Not on file   Tobacco Use    Smoking status: Never Smoker    Smokeless tobacco: Never Used   Substance and Sexual Activity    Alcohol use: Yes     Comment: rarely    Drug use: Never    Sexual activity: Yes     Partners: Male   Other Topics Concern    Not on file   Social History Narrative    Not on file     Social Determinants of Health     Financial Resource Strain: Not on file   Food Insecurity: Not on file   Transportation Needs: Not on file   Physical Activity: Not on file   Stress: Not on file   Social Connections: Not on file   Intimate Partner Violence: Not on file   Housing Stability: Not on file       Family Status   Relation Status    Mo (Not Specified)    Fa (Not Specified)       Objective:  Vitals:    07/23/20 1309   BP: 114/69   BP Location: Right arm   BP Patient Position: Sitting   BP cuff size: Large   Pulse: 67   Temp: 98 F (36.7 C)   TempSrc: Temporal   SpO2: 100%   Weight: 110.2 kg  (243 lb)   Height: 5\' 9"  (1.753 m)     Estimated body mass index is 35.88 kg/m as calculated from the following:    Height as of this encounter: 5\' 9"  (1.753 m).    Weight as of this encounter: 110.2 kg (243 lb).    Physical Exam  Vitals reviewed.   Constitutional:       General: She is not in acute distress.     Appearance: Normal appearance. She is not diaphoretic.   HENT:      Head: Normocephalic and atraumatic.      Nose: Nose normal.   Eyes:      Extraocular Movements: Extraocular movements intact.      Conjunctiva/sclera: Conjunctivae normal.   Cardiovascular:      Rate and Rhythm: Normal rate and regular rhythm.      Pulses: Normal pulses.      Heart sounds: Normal heart sounds.   Pulmonary:      Effort: Pulmonary effort is normal. No respiratory distress.      Breath sounds: Normal breath sounds. No wheezing, rhonchi or rales.   Abdominal:      General: Bowel sounds are normal. There is no distension.      Palpations: Abdomen is soft.      Tenderness: There is no guarding or rebound.      Comments: Mild discomfort with palpation to bilateral pelvis   Musculoskeletal:         General: Normal range of motion.      Cervical back: Normal range of motion.   Skin:     General: Skin is warm and dry.   Neurological:      Mental Status: She is alert and oriented to person, place, and time.   Psychiatric:         Behavior: Behavior normal.          Lab:   Results for orders placed or performed in visit on 01/11/20   COVID-19 Detection Assay Asym   Result Value Ref Range  COVID-19 Source Nasal     COVID-19 Coronavirus Result Not Detected        Health Maintenance   Topic Date Due    Pap Smear  Never done    Hepatitis C Screening  Never done    Tetanus (1 - Tdap) Never done    COVID-19 Vaccine (3 - Booster for Pfizer series) 12/29/2019    Influenza (Season Ended) 11/12/2020    PHQ9 Depression Monitoring doc flowsheet  11/22/2020    Polio Vaccine  Aged Out    HPV Vaccine <= 26 Yrs  Aged Out    Meningococcal  MCV4 Vaccine  Aged Out    Pneumococcal Vaccine  Aged Out         Lanaya was seen today for establish care.    Diagnoses and all orders for this visit:    Female fertility problem  Comments:  Possible hx of PCOS. Referral to [facility] clinic. Hx of one prior delivery and one ectopic pregnancy with irregular periods following.   Orders:  -     Infertility/REI Clinic  -     US Pelvic Transabd/Transvag Combination; Future    Obesity (BMI 35.0-39.9 without comorbidity)  Comments:  Reviewed nutrition referral. Not a candidate for bypass procedure. Advised on diet modifications and exercise. CTM. Reviewed medications for diabetes/prediabete  Orders:  -     CBC w/ Diff Lavender; Future  -     Comprehensive Metabolic Panel; Future  -     Nutrition Clinic    Prediabetes  Comments:  Repeat labs. Advised on diet and weight.   Orders:  -     Glycosylated Hgb(A1C), Blood Lavender; Future    Pelvic pain  Comments:  Acute on chronic, bilateral, hx of possible ovarian cysts. +heavy menstrual bleeding requiring medications. Due for PAP. Routine labs, [place]. CTM.   Orders:  -     US Pelvic Transabd/Transvag Combination; Future    Need for hepatitis C screening test  Comments:  Routine  Orders:  -     Hepatitis C Antibody (Hep C Ab); Future    Screening cholesterol level  Comments:  Routine  Orders:  -     Lipid Panel Green Plasma Separator Tube; Future    Screening for diabetes mellitus  Comments:  Routine        Patient Instructions   Por favor llama la clinica de REI, el doctor se llama Dr. Van Clines.       Medication Review:  Medications reviewed with patient and medication list reconciled.  Over the counter medications, herbal therapies and supplements reviewed.  Patient's understanding and response to medications assessed.   Barriers to medications assessed and addressed.   Risks, benefits, alternatives to medications reviewed.    Patient Instruction: See Patient Education Section    No barriers to learning, verbalizes  understanding of teaching and instructions.    Dionicia Abler MD MPH  [facility]  Family Medicine

## 2020-07-23 NOTE — Patient Instructions (Signed)
Por favor llama la clinica de REI, el doctor se llama Dr. Van Clines.

## 2020-07-23 NOTE — Interdisciplinary (Signed)
MARTTI (My Accessible Real-Time Trusted Interpreter) was used to converse with pt, translator ID [id number]

## 2020-07-25 ENCOUNTER — Telehealth (INDEPENDENT_AMBULATORY_CARE_PROVIDER_SITE_OTHER): Payer: Self-pay | Admitting: Family Practice

## 2020-07-25 ENCOUNTER — Other Ambulatory Visit (INDEPENDENT_AMBULATORY_CARE_PROVIDER_SITE_OTHER): Payer: TRICARE Extra—PPO

## 2020-08-02 ENCOUNTER — Telehealth (INDEPENDENT_AMBULATORY_CARE_PROVIDER_SITE_OTHER): Payer: Self-pay | Admitting: Family Practice

## 2020-09-29 ENCOUNTER — Encounter (INDEPENDENT_AMBULATORY_CARE_PROVIDER_SITE_OTHER): Payer: Self-pay | Admitting: Internal Medicine

## 2020-09-29 DIAGNOSIS — Z0000 Encounter for general adult medical examination without abnormal findings: Secondary | ICD-10-CM

## 2020-10-22 ENCOUNTER — Telehealth (INDEPENDENT_AMBULATORY_CARE_PROVIDER_SITE_OTHER): Payer: TRICARE Extra—PPO | Admitting: Gynecology

## 2020-10-22 ENCOUNTER — Other Ambulatory Visit: Payer: Self-pay

## 2020-10-22 DIAGNOSIS — N979 Female infertility, unspecified: Secondary | ICD-10-CM

## 2020-10-22 NOTE — Progress Notes (Signed)
---------------------(data below generated by Brooks Sailors, MD)--------------------     Patient Verification & Telephone Consent & Financial Waiver:    1. Identity: I have verified this patient's identity to be accurate.   2. Consent: I have personally obtained verbal consent from the patient/ surrogate (noting all elements below) to perform this voluntary telephone clinical evaluation (including obtaining history, performing examination and reviewing data provided by the patient). The patient/ surrogate has the right to refuse this evaluation. I have explained risks (including potential loss of confidentiality), benefits, alternatives, and the potential need for subsequent face to face care. Patient/ surrogate understands that there is a risk of medical inaccuracies given that our recommendations will be made based on reported data (and we must therefore assume this information is accurate). Knowing that there is a risk that this information is not reported accurately, and that the audio, or data transmitted may be incomplete, the patient/ surrogate agrees to proceed with evaluation and holds [place] harmless knowing these risks.  3. Healthcare Team: The patient/ surrogate has been notified that other healthcare professionals (including students, residents and Metallurgist) may be involved in this telephone evaluation. All laws concerning confidentiality and patient access to medical records and copies of medical records apply to telephone evaluations.  4. Privacy: I have verbally provided the patient/ surrogate with the [facility] web link in [place] (https://[email or web address].se.aspx) or Spanish (https://health.https://www.matthews.info/.aspx). The patient/ surrogate acknowledges both being provided the NPP link, and has been offered to have the NPP mailed to the patient/ surrogate by [place] mail. The patient/ surrogate has voiced understanding an acknowledgement of receipt of this NPP web  address. If the patient/surrogate has elected to receive the NPP via [place] mail, I verify that the NPP will be sent promptly to the patient/surrogate via [place] mail.  5. Capacity: I have reviewed this above verification and consent paragraph with the patient/ surrogate and the patient is capacitated or has a surrogate. If the patient is not capacitated to understand the above, and no surrogate is available, since this is not an emergency evaluation, the visit will be rescheduled until such time that the patient can consent, or the surrogate is available to consent. If this is an emergency evaluation and the patient is not capacitated to understand the above, and no surrogate is available, I am proceeding with this evaluation as this is felt to be an emergency setting and no appropriate specialist is available at the bedside to perform these evaluations.  6. Financial Waiver: I have personally verbally informed the patient/ surrogate that this evaluation will be a billable encounter similar to an in-person clinic visit, and the patient/ surrogate has agreed to pay the fee for services rendered. If we are billing insurance for the patient's telephone visit, her out-of-pocket cost will be determined based on her plan and will be billed to her. The patient/ surrogate has also been informed that if the patient does not have insurance or does not wish to use insurance, [facility] price for a primary care telehealth visit is $59.00 and specialist telehealth visit is $88.00. I have further informed the patient/ surrogate that in the event the patient has additional services provided in conjunction with the specialty visit (Ex. Psychotherapy services), those services will be billed at the current rate less a 45% discount.   7. Intra-State Location: The patient/ surrogate attests to understanding that if the patient accesses these services from a location outside of [place], that the patient does so at the  patient's own risk and initiative and that the patient is ultimately responsible for compliance with any laws or regulations associated with the patient's use.  8. Specific Use: The patient/ surrogate understands that [place] makes no representation that materials or services delivered via telephone services, or listed on telemedicine websites, are appropriate or available for use in any other location.     Demographics:  Medical Record #: [id number]  Date: October 22, 2020  Patient Name: Linda Landry  DOB: Jun 19, 1990  Age: 30 year old  Sex: female  Location: Home address on file     Evaluator(s):  Linda Landry was evaluated by me today.   Clinic Location: [facility] CLINIC  [place] [facility] SERVICES  [address]  [place] [address]                Pt only speaks spanish and so will reschedule for a in clinic appt with interpreter.  Perhaps on a Monday, Tuesday or Friday.  SA

## 2020-10-23 NOTE — Telephone Encounter (Signed)
Mychart message sent.

## 2020-10-30 NOTE — Progress Notes (Signed)
[facility] Consultation Note: New Patient    Patient Name: Linda Landry  Date: 10/31/20    30 year old G64P1011 female with history of pre-diabetes who was referred by Dr.Owens for consultation regarding infertility.    Occupation: works at home    G1: 2015 Term, delivered via C/S  G2: 2018, ectopic pregnancy, managed with     2014 to present never used contraception.     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 Was told she was pregnant and received MTX and then had weekly hcgs until zero.     Is having intercourse about three times per week.     Menstrual History:   Patient's last menstrual period was 10/31/2020 (exact date).  Menses every: irregular, q30-60 days, x5-7 days  IMB: no  Ovulation Testing: none    Tubal Factor:   Hx of PID/STD: none  Cervical Procedures: none  Dysmenorrhea: yes, heavy periods with passing clots  Dyspareunia: no    ROS  Review of systems: Constitutional: negative    Except as documented above and in the HPI, all other systems were reviewed and lacked significant symptoms.    Past medical history: Pre-diabetes  Past surgical history: none  Medications: none   Allergies: Latex allergy    FAMILY HISTORY: Mother diabetes, Father heart disease, maternal grandmother    SOCIAL HISTORY  Rec. Drugs: Marijuana in the past but nothing for many years  Tobacco: no  Alcohol: socially  Caffeine: 1-2/day    Female Factor:   Name: [name] (DOB: 07/01/1991) Age: 74   Pregnancies: 2   Occupation: Chief Technology Officer   Past medical history: None  Past surgical history: None  Medications: None  Tobacco: No  Alcohol: Socially  Rec. Drugs: None  Excess Heat: None  Semen  analysis: None    Genetic counselor referral not accepted    Physical Exam:    BP 104/66 (BP Location: Left arm, BP Patient Position: Sitting, BP cuff size: Large)    Pulse 72    Temp 96.8 F (36 C) (Temporal)    Resp 16    Ht 5\' 9"  (1.753 m)    Wt 114.8 kg (253 lb)    LMP 10/31/2020 (Exact Date)    SpO2 100%    BMI 37.36 kg/m   Body mass index is 37.36 kg/m.    CONSTITUTIONAL: Conversant, well developed female in NAD.  EYES: Anicteric sclerae; no lid-lag or proptosis.  RESPIRATORY: Normal respiratory effort.  CARDIOVASCULAR: No peripheral edema.  SKIN: No rash, lesions or ulcers.  MUSCULOSKELETAL No digital cyanosis. Normal gait and station.  NEURO: Cranial nerves II--XII grossly intact.  PSYCH: Intact judgment and insight. A&OX3 with a cordial affect.  PELVIC: Non-tender  ABDO: Non-tender    Complete Pelvic Ultrasound  Right Ovary: poorly visualized   Left Ovary: poorly visualized   Cysts: None   Uterus: Normal shape and contour on 3D, though limited views  Assessment: Ovulatory dysfunction, possible tubal factor  I reviewed the test results thus far obtained including any sperm parameters, blood tests and the ultrasound assessment of uterine status and ovarian reserve. I also explained the need to obtain further testing. The purpose of the necessary detailed assessment is to assess the patient and her partner with regards to sperm, ovulation, tubal patency, integrity of the uterus and to, on a individual basis, assess the impact of age on ovarian reserve.    Recommended Evaluation: HSG, Day 3 Labs, TSH/Prolactin, Semen analysis, [facility] and 3D Sonohysterogram    Plan: The patient is to return to formulate a treatment plan after testing is complete.     The goal of treatment will be to formulate a plan that I think is medically sound and is acceptable to the patient. The management options considered will range from no treatment, through clomid or letrozole with or without IUI, FSH injections with IUI  through to more invasive treatments such as IVF and other assisted reproductive procedures. Questions answered.    Patient seen and discussed with Dr. Michae Kava, attending physician.    Larena Glassman, MD   PGY2  Department of Obstetrics, Gynecology, and Reproductive Sciences  Certified Spanish Bilingual Clinician #[id number]      I have participated in the evaluation and discussion of options with the patient.   I agree with the assessment and plan as documented. Questions answered.    Van Clines, MD

## 2020-11-01 ENCOUNTER — Other Ambulatory Visit (INDEPENDENT_AMBULATORY_CARE_PROVIDER_SITE_OTHER): Payer: TRICARE Extra—PPO

## 2020-11-01 ENCOUNTER — Other Ambulatory Visit: Payer: Self-pay

## 2020-11-01 ENCOUNTER — Encounter (INDEPENDENT_AMBULATORY_CARE_PROVIDER_SITE_OTHER): Payer: Self-pay | Admitting: Gynecology

## 2020-11-01 ENCOUNTER — Ambulatory Visit (INDEPENDENT_AMBULATORY_CARE_PROVIDER_SITE_OTHER): Payer: TRICARE Extra—PPO | Admitting: Gynecology

## 2020-11-01 VITALS — BP 104/66 | HR 72 | Temp 96.8°F | Resp 16 | Ht 69.0 in | Wt 253.0 lb

## 2020-11-01 DIAGNOSIS — Z8759 Personal history of other complications of pregnancy, childbirth and the puerperium: Secondary | ICD-10-CM

## 2020-11-01 DIAGNOSIS — E288 Other ovarian dysfunction: Secondary | ICD-10-CM

## 2020-11-01 DIAGNOSIS — Z6837 Body mass index (BMI) 37.0-37.9, adult: Secondary | ICD-10-CM

## 2020-11-01 DIAGNOSIS — N925 Other specified irregular menstruation: Secondary | ICD-10-CM

## 2020-11-01 DIAGNOSIS — R7303 Prediabetes: Secondary | ICD-10-CM

## 2020-11-01 DIAGNOSIS — N946 Dysmenorrhea, unspecified: Secondary | ICD-10-CM

## 2020-11-01 DIAGNOSIS — Z0000 Encounter for general adult medical examination without abnormal findings: Secondary | ICD-10-CM

## 2020-11-01 DIAGNOSIS — Z3189 Encounter for other procreative management: Secondary | ICD-10-CM

## 2020-11-01 DIAGNOSIS — E669 Obesity, unspecified: Secondary | ICD-10-CM

## 2020-11-01 MED ORDER — DOXYCYCLINE HYCLATE 100 MG OR CAPS
100.0000 mg | ORAL_CAPSULE | Freq: Two times a day (BID) | ORAL | 0 refills | Status: AC
Start: 2020-11-01 — End: 2020-11-04

## 2020-11-01 NOTE — Patient Instructions (Addendum)
Thank you for visiting our office today!    X-Ray HSG in Radiology: Your physician has sent a referral to [facility] for a X-Ray Hysterosalpingogram to check the patency of your fallopian tubes.     * Scheduling - Please call [facility] at [phone] to schedule your appointment. Generally this test is scheduled at the end of bleeding  bleeding following your monthly period.      * Pre-Medication - Please check with interventional radiology as they may recommend a pre-procedure antibiotic for you to start 1 day prior     * Results - Results are generally available within 24 hours and sent to your provider. Your results with be reviewed with you at your next appointment. Please schedule this appointment for follow up unless instructed otherwise.        - SALINE SONOHYSTEROGRAM: Call our office at [phone] when you start your next period (on day 1 of full flow bleeding) to schedule your Saline Sonohysterogram.     Timing - This is scheduled for about a week after you start your period, around cycle day 7-12.  Antibiotic - Pick up your Doxycycline prescription (sent to pharmacy today) NOW and save for one day before this test. You will take this twice a day for 5 days. Please take 30 minutes after a meal.  Pre-Medications: We suggest taking Ibuprofen or Motrin 1-2 hours prior to this exam.   Please eat and drink the day of your appointment, as fasting for this visit is not required or recommended.  A full bladder is recommended. We will ask you to leave a urine sample when you arrive.    - BLOODWORK: Complete electronically ordered labs at any [facility] lab location (see handout if given or visit: [email or web address]).       Complete on Day 3 of your cycle  Fasting is Best!    ______________________________________________________________________________________  - FEMALE TESTING: Please have your spouse/partner complete a Semen Analysis and Infectious Disease  Screening ([facility]) at [facility]. Instructions are detailed in the Female Testing Packet.    NON-[facility] Patients (if applicable)  1. Register to obtain MRN: Call [id number]  2. Call Reproductive Endocrinology at [phone] or send MyChart message with MRN to obtain electronic order and we will send the paperwork needed to complete the lab.    Required testing:  1) Semen Analysis testing at [facility]  2) Infectious Disease Labs (Blood work)    _________________________________________________________________________________________  Referrals:    Weight management with [facility]; call to schedule consult at [phone]  ________________________________________________________________________________________    You received a lot of information today. If you have any questions regarding your visit please call our clinic at [phone] or send [place] a message via MyChart so that we can further assist you.    You may receive a survey regarding your visit today via mail or U.S. Mail. We ask that you please provide feedback from your visit. Your opinion matters to [place]!    Thank you,     The Reproductive Endocrinology and Fertility Team  For COVID Vaccination Scheduling: [email or web address]

## 2020-11-02 ENCOUNTER — Other Ambulatory Visit: Payer: TRICARE Extra—PPO | Attending: Gynecology

## 2020-11-02 DIAGNOSIS — Z0189 Encounter for other specified special examinations: Secondary | ICD-10-CM

## 2020-11-02 DIAGNOSIS — Z3189 Encounter for other procreative management: Secondary | ICD-10-CM | POA: Insufficient documentation

## 2020-11-02 LAB — FOLLICLE STIMULATING HORMONE, BLOOD: FSH: 5.5 m[IU]/mL

## 2020-11-02 LAB — ESTRADIOL, BLOOD: Estradiol, Blood: 32 pg/mL

## 2020-11-02 LAB — TSH, BLOOD: TSH: 0.89 u[IU]/mL (ref 0.27–4.20)

## 2020-11-02 LAB — ANTI-MULLERIAN HORMONE: Anti-Mullerian Hormone: 5.33 ng/mL (ref 0.89–9.85)

## 2020-11-02 NOTE — Interdisciplinary (Signed)
Blood drawn from left arm with 21 gauge needle. 2 tubes taken.   Patient identity authenticated by Shelly Coss.    Dr Michae Kava today only

## 2020-11-08 NOTE — Progress Notes (Signed)
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      30 year old G26P1011 female with history of pre-diabetes who was referred by Dr.Owens for consultation regarding infertility.    Prior hx:  Occupation: works at home  G1: 2015 Term, delivered via C/S  G2: 2018, ectopic pregnancy, managed with MTX     - 2014 to present never used contraception.  - Is having intercourse about three times per week.  - Menses every: irregular, q30-60 days, x5-7 days  - IMB: no   - Hx of PID/STD: none  - Dysmenorrhea: yes, heavy periods with passing clots  - Dyspareunia: no  - Genetic counselor referral not accepted    Female Factor:   Name: Linda Landry (DOB: 07/01/1991) Age: 75   Pregnancies: 2   Occupation: Chief Technology Officer   Past medical history: None  Past surgical history: None  Medications: None  Tobacco: No  Alcohol: Socially  Rec. Drugs: None  Excess Heat: None  Semen analysis: None     Result +/- date Notes   Attending Agarwal    Diagnosis Secondary Infertility    SA - [facility] Pending    [facility] Pending    IUI consent (date)     FSH/E2/AMH Lab Results   Component Value Date    FSH 5.5 11/02/2020    ESTRADIOLBL 32 11/02/2020    MULL 5.33 11/02/2020       TSH Lab Results   Component Value Date    TSH 0.89 11/02/2020       Prolactin No results found for: PROLACTIN    IVF discussion     HSG Ordered for outside HSG    SIS Findings: Difficult exam due to habitus. Normal uterine contour without any obvious evidence of congenital anomaly, no fibroids or polyps or adhesive disease seen. Please see separate procedure note and IMPAX for images. 11/09/20   MMH referral     Testosterone No results found for: TESTOSTFE    A1c/Fasting insulin No results found for: A1C, INSULINFAST          Physical Exam:    BP 101/69 (BP Location: Left arm, BP Patient Position: Sitting, BP  cuff size: Large)    Pulse 73    Temp 98.2 F (36.8 C) (Temporal)    Resp 16    Ht 5\' 9"  (1.753 m)    Wt 115.3 kg (254 lb 3.2 oz)    LMP 10/31/2020 (Exact Date)    SpO2 100%    BMI 37.54 kg/m     CONSTITUTIONAL: Conversant, well developed female in NAD.  EYES: Anicteric sclerae; no lid-lag or proptosis.  RESPIRATORY: Normal respiratory effort.  CARDIOVASCULAR: No peripheral edema.    Imaging:   11/02/2020 Complete Pelvic Ultrasound  Right Ovary: poorly visualized   Left Ovary: poorly visualized   Cysts: None   Uterus: Normal shape and contour on 3D, though limited views  Please see procedure note and images for details.    11/09/2020 SIS  Findings: Difficult exam due to habitus. Normal uterine contour without any obvious evidence of congenital anomaly, no fibroids or polyps or adhesive disease seen. Please see separate procedure note and IMPAX for images.    A/P:   Linda Landry is a 30 year old female G0 with secondary  infertility x 4 years who is here for infertility management.    #Infertility:  - AMH 5.33, normal day 3 labs  - SIS today showed no obvious abnormalities although view limited due to habitus, please see IMPAX for details  - HSG, semen analysis and law labs pending, patient instructed to follow-up once testing completed  - Note will need Large Graves speculum for exams      Patient seen and evaluated with Michae Kava, attending physician.    Namon Cirri, MD  Department of Obstetrics and Gynecology  [facility] Fellow PGY-5        I have personally participated in the performance and evaluation and the ultrasound examination of the patient, including review of the images in real time. I agree with the assessment and plan as well as the interpretation above as documented above. We discussed labs, need for SA.  Also SIS findings and need for HSG with the patient. Questions answered.     Van Clines, MD  30 mins was spent with the patient and with pre/post visit preparation/charting.

## 2020-11-09 ENCOUNTER — Ambulatory Visit (INDEPENDENT_AMBULATORY_CARE_PROVIDER_SITE_OTHER): Payer: TRICARE Extra—PPO | Admitting: Gynecology

## 2020-11-09 ENCOUNTER — Encounter (INDEPENDENT_AMBULATORY_CARE_PROVIDER_SITE_OTHER): Payer: Self-pay | Admitting: Gynecology

## 2020-11-09 ENCOUNTER — Other Ambulatory Visit (INDEPENDENT_AMBULATORY_CARE_PROVIDER_SITE_OTHER): Payer: TRICARE Extra—PPO

## 2020-11-09 VITALS — BP 101/69 | HR 73 | Temp 98.2°F | Resp 16 | Ht 69.0 in | Wt 254.2 lb

## 2020-11-09 DIAGNOSIS — Z3141 Encounter for fertility testing: Secondary | ICD-10-CM

## 2020-11-09 DIAGNOSIS — Z319 Encounter for procreative management, unspecified: Secondary | ICD-10-CM

## 2020-11-09 DIAGNOSIS — Z3200 Encounter for pregnancy test, result unknown: Secondary | ICD-10-CM

## 2020-11-09 NOTE — Patient Instructions (Addendum)
Today you were seen for a Saline Sonohysterogram:    - You can expect spotting and cramping for a day or two. Please take Tylenol/Ibuprofen as needed for cramping.    - If you experience fever/chills, severe cramping, or bleeding that is soaking through a pad every 2 hours, please call our office or after hours number [phone]    - You may resume intercourse tomorrow. This procedure sometimes results in a bump in fertility.        -have partner complete female testing and also for you to complete HSG with imaging healthcare specialists. Once all testing is complete, call our office to set up a video visit to review results      _________________________________________________________________________    Linda Landry may receive a survey regarding your appointment via e-mail or U.S. Mail. We appreciate your feedback and value your opinion!    If you have any questions regarding today's visit, please call our office at [phone] or use your MyChart portal to send a Provider Message.    Thank you!     The Reproductive Endocrinology and Infertility Team

## 2020-11-09 NOTE — Procedures (Signed)
Procedure: Saline infusion sonogram   Friday November 09, 2020    Patient was consented for saline infusion sonogram. The procedure was described to the patient, who was allowed to ask questions. She expressed understanding of procedure. Discussed the risks, benefits, and alternatives of procedure. In particular discussed the risks of: hemorrhage, infection, uterine perforation with injury to nearby organs. Patient understood all of the above, and wishes to proceed.     Primary Physician: Dr. Van Clines   Assistant: Dr. Namon Cirri    Procedure: Large graves speculum was inserted vaginally. Cervix was cleaned with betadine swab x 2. SIS catheter was inserted under direct visualization transcervically. Transvaginal ultrasonography was then performed and approximately 10-20cc of sterile NS infused.     Findings: Difficult exam due to habitus. Normal uterine contour without any obvious evidence of congenital anomaly, no fibroids or polyps or adhesive disease seen. Please see separate procedure note and IMPAX for images.    Dr. Michae Kava was present and attending throughout the procedure.    Namon Cirri, MD  Department of Obstetrics and Gynecology  [facility] Fellow PGY-5    I have personally participated in the performance and evaluation and the ultrasound examination of the patient, including review of the images in real time. I agree with the assessment and plan as well as the interpretation above as documented above. We discussed findings with the patient. Questions answered.     Van Clines, MD

## 2020-12-04 NOTE — Telephone Encounter (Signed)
Mychart message sent.

## 2020-12-07 ENCOUNTER — Encounter (INDEPENDENT_AMBULATORY_CARE_PROVIDER_SITE_OTHER): Payer: Self-pay | Admitting: Gynecology

## 2020-12-18 ENCOUNTER — Other Ambulatory Visit: Payer: Self-pay

## 2020-12-18 ENCOUNTER — Other Ambulatory Visit: Payer: TRICARE Extra—PPO | Attending: Family Practice

## 2020-12-18 ENCOUNTER — Ambulatory Visit
Admission: RE | Admit: 2020-12-18 | Discharge: 2020-12-18 | Disposition: A | Discharge status: Home / Self Care | Payer: TRICARE Extra—PPO | Attending: Family Practice | Admitting: Family Practice

## 2020-12-18 DIAGNOSIS — Z13220 Encounter for screening for lipoid disorders: Secondary | ICD-10-CM | POA: Insufficient documentation

## 2020-12-18 DIAGNOSIS — R7303 Prediabetes: Secondary | ICD-10-CM | POA: Insufficient documentation

## 2020-12-18 DIAGNOSIS — Z1159 Encounter for screening for other viral diseases: Secondary | ICD-10-CM | POA: Insufficient documentation

## 2020-12-18 DIAGNOSIS — R102 Pelvic and perineal pain: Secondary | ICD-10-CM | POA: Insufficient documentation

## 2020-12-18 DIAGNOSIS — E669 Obesity, unspecified: Secondary | ICD-10-CM | POA: Insufficient documentation

## 2020-12-18 DIAGNOSIS — N979 Female infertility, unspecified: Secondary | ICD-10-CM

## 2020-12-18 LAB — CBC WITH DIFF, BLOOD
ANC-Automated: 3.1 10*3/uL (ref 1.6–7.0)
Abs Basophils: 0.1 10*3/uL (ref <0.2)
Abs Eosinophils: 0.1 10*3/uL (ref 0.0–0.5)
Abs Lymphs: 2.4 10*3/uL (ref 0.8–3.1)
Abs Monos: 0.5 10*3/uL (ref 0.2–0.8)
Basophils: 1 %
Eosinophils: 2 %
Hct: 38.1 % (ref 34.0–45.0)
Hgb: 12.3 gm/dL (ref 11.2–15.7)
Lymphocytes: 39 %
MCH: 26.9 pg (ref 26.0–32.0)
MCHC: 32.3 g/dL (ref 32.0–36.0)
MCV: 83.4 um3 (ref 79.0–95.0)
MPV: 10.7 fL (ref 9.4–12.4)
Monocytes: 8 %
Plt Count: 284 10*3/uL (ref 140–370)
RBC: 4.57 10*6/uL (ref 3.90–5.20)
RDW: 13.1 % (ref 12.0–14.0)
Segs: 50 %
WBC: 6.2 10*3/uL (ref 4.0–10.0)

## 2020-12-18 LAB — LIPID(CHOL FRACT) PANEL, BLOOD
Cholesterol: 124 mg/dL (ref <200)
HDL-Cholesterol: 40 mg/dL
LDL-Chol (Calc): 74 mg/dL (ref <160)
Non-HDL Cholesterol: 84 mg/dL
Triglycerides: 50 mg/dL (ref 10–170)

## 2020-12-18 LAB — COMPREHENSIVE METABOLIC PANEL, BLOOD
ALT (SGPT): 31 U/L (ref 0–33)
AST (SGOT): 20 U/L (ref 0–32)
Albumin: 4.2 g/dL (ref 3.5–5.2)
Alkaline Phos: 48 U/L (ref 40–130)
Anion Gap: 9 mmol/L (ref 7–15)
BUN: 13 mg/dL (ref 6–20)
Bicarbonate: 25 mmol/L (ref 22–29)
Bilirubin, Tot: 0.29 mg/dL (ref <1.2)
Calcium: 9.3 mg/dL (ref 8.5–10.6)
Chloride: 104 mmol/L (ref 98–107)
Creatinine: 0.73 mg/dL (ref 0.51–0.95)
GFR: >60 mL/min
Glucose: 112 mg/dL — ABNORMAL HIGH (ref 70–99)
Potassium: 4.5 mmol/L (ref 3.5–5.1)
Sodium: 138 mmol/L (ref 136–145)
Total Protein: 6.5 g/dL (ref 6.0–8.0)
eGFR Based on CKD-EPI 2021 Equation: >60 mL/min

## 2020-12-18 LAB — HEPATITIS C AB, BLOOD: Hepatitis C Ab: NONREACTIVE

## 2020-12-18 LAB — GLYCOSYLATED HGB(A1C), BLOOD: Glyco Hgb (A1C): 5.9 % — ABNORMAL HIGH (ref 4.8–5.8)

## 2020-12-18 NOTE — Interdisciplinary (Signed)
Blood drawn from right arm with 21 gauge needle. 4 tubes taken.   Patient identity authenticated by Michelle Lehoski.

## 2020-12-24 ENCOUNTER — Encounter (INDEPENDENT_AMBULATORY_CARE_PROVIDER_SITE_OTHER): Payer: Self-pay | Admitting: Family Practice

## 2020-12-24 ENCOUNTER — Other Ambulatory Visit: Payer: Self-pay

## 2020-12-24 ENCOUNTER — Ambulatory Visit (INDEPENDENT_AMBULATORY_CARE_PROVIDER_SITE_OTHER): Payer: TRICARE Extra—PPO | Admitting: Family Practice

## 2020-12-24 VITALS — BP 100/68 | HR 78 | Temp 98.0°F | Ht 69.0 in | Wt 252.6 lb

## 2020-12-24 DIAGNOSIS — R7303 Prediabetes: Secondary | ICD-10-CM

## 2020-12-24 DIAGNOSIS — M79605 Pain in left leg: Secondary | ICD-10-CM

## 2020-12-24 DIAGNOSIS — N6320 Unspecified lump in the left breast, unspecified quadrant: Secondary | ICD-10-CM

## 2020-12-24 DIAGNOSIS — N63 Unspecified lump in unspecified breast: Secondary | ICD-10-CM

## 2020-12-24 DIAGNOSIS — M79604 Pain in right leg: Secondary | ICD-10-CM

## 2020-12-24 DIAGNOSIS — E669 Obesity, unspecified: Secondary | ICD-10-CM

## 2020-12-24 DIAGNOSIS — Z1339 Encounter for screening examination for other mental health and behavioral disorders: Secondary | ICD-10-CM

## 2020-12-24 DIAGNOSIS — Z1389 Encounter for screening for other disorder: Secondary | ICD-10-CM

## 2020-12-24 DIAGNOSIS — Z23 Encounter for immunization: Secondary | ICD-10-CM

## 2020-12-24 NOTE — Progress Notes (Signed)
Chief Complaint:  Leg Pain (Both leg pain, 2 weeks.) and Results      Subjective:    Linda Landry is a 30 year old female who presents with   Chief Complaint   Patient presents with    Leg Pain     Both leg pain, 2 weeks.    Results       Patient here for follow up visit. Doing well. ROS as below.     #Lab results: Completed 12/18/2020.  #Prediabetes    #Bilateral leg pain: "throbbing." Occurs when exercising, walking up stairs, jump roping. Used ibuprofen with mild improvement. Also pain of the right ankle. No trauma. No symptoms when sleeping. When cold, feels changes more. Mothers sibling with SLE, Maternal GMA w/ hx of breast cancer. Pt did an exam and felt a lump of the left breast, will have some pain with her periods as well.     #HCM: Due for vaccines.     Reviewed past medical history, surgical, family, medication and social history.   Reviewed outside medical records if available.     FM PHQ2 12/24/2020 07/23/2020   PHQ2 Total 0 2     FM PHQ9 score 12/24/2020 07/23/2020   PHQ9 Patient Summary Score (calculated) 0 12     GAD 7 07/23/2020 12/24/2020 12/24/2020   1. Feeling nervous, anxious or on edge 1 - 0   2. Not being able to stop or control worrying 0 - 0   3. Worrying too much about different things 1 0 0   4. Trouble relaxing 1 0 0   5. Being so restless that it is hard to sit still 0 0 0   6. Being easily annoyed or irritable 3 0 0   7. Feeling afraid as if something awful might happen 0 0 0   GAD7 Patient Total 6 - 0   If you checked off any problems, how difficult have these problems made it for you to do your job along with other people? Somewhat difficult Not difficult at all Not difficult at all         Review of Systems   Constitutional: Negative for fever.   HENT: Negative for sore throat.    Respiratory: Negative for shortness of breath.    Cardiovascular: Negative for chest pain.   Gastrointestinal: Negative for constipation, diarrhea, nausea and vomiting.   Genitourinary: Negative for  dysuria.   Musculoskeletal:        Leg pain   Skin: Negative for rash.   Neurological: Negative for headaches.     Pelvic US 12/18/2020  FINDINGS:  UTERUS:  Anteverted  Size: 9.5 x 5.6 x 4.5 cm  0.9 cm endometrial thickness  Unremarkable    RT OVARY:  Not visualized.    LT OVARY:  Not visualized.    CUL-DE-SAC:  No free fluid    CONCURRENT SUPERVISION:  I have reviewed the images and agree with the resident's interpretation.           Signed by: Orlene Erm 12/18/2020 20:30:47  IMPRESSION:  IMPRESSION:  *Physiologic uterus.  *Ovaries not visualized.      No current outpatient medications on file prior to visit.     No current facility-administered medications on file prior to visit.       No Known Allergies    Patient Active Problem List   Diagnosis    Pelvic pain    Prediabetes    Obesity (BMI 35.0-39.9 without comorbidity)  Female fertility problem       No past medical history on file.    Past Surgical History:   Procedure Laterality Date    CESAREAN SECTION, CLASSIC         Social History     Socioeconomic History    Marital status: Married     Spouse name: Not on file    Number of children: Not on file    Years of education: Not on file    Highest education level: Not on file   Occupational History    Not on file   Tobacco Use    Smoking status: Never Smoker    Smokeless tobacco: Never Used   Substance and Sexual Activity    Alcohol use: Yes     Comment: rarely    Drug use: Never    Sexual activity: Yes     Partners: Male   Other Topics Concern    Not on file   Social History Narrative    Not on file     Social Determinants of Health     Financial Resource Strain: Not on file   Food Insecurity: Not on file   Transportation Needs: Not on file   Physical Activity: Not on file   Stress: Not on file   Social Connections: Not on file   Intimate Partner Violence: Not on file   Housing Stability: Not on file       Family Status   Relation Status    Mo (Not Specified)    Fa (Not Specified)        Objective:  Vitals:    12/24/20 1304   BP: 100/68   BP Location: Left arm   BP Patient Position: Sitting   BP cuff size: Large   Pulse: 78   Temp: 98 F (36.7 C)   TempSrc: Temporal   SpO2: 100%   Weight: 114.6 kg (252 lb 9.6 oz)   Height: 5' 9"  (1.753 m)     Estimated body mass index is 37.3 kg/m as calculated from the following:    Height as of this encounter: 5' 9"  (1.753 m).    Weight as of this encounter: 114.6 kg (252 lb 9.6 oz).    Physical Exam  Vitals reviewed.   Constitutional:       General: She is not in acute distress.     Appearance: Normal appearance. She is not diaphoretic.   HENT:      Head: Normocephalic and atraumatic.      Nose: Nose normal.   Eyes:      Extraocular Movements: Extraocular movements intact.      Conjunctiva/sclera: Conjunctivae normal.   Cardiovascular:      Rate and Rhythm: Normal rate and regular rhythm.      Pulses: Normal pulses.      Heart sounds: Normal heart sounds.   Pulmonary:      Effort: Pulmonary effort is normal. No respiratory distress.      Breath sounds: Normal breath sounds. No wheezing, rhonchi or rales.   Musculoskeletal:         General: Normal range of motion.      Cervical back: Normal range of motion.      Comments: Breast exam: Left: ~3 oclock nodule, slightly ttp  Right 9 oclock nodule, slightly ttp    Bilateral legs: non ttp, no edema, full ROM. Neg Homan's sign.    Skin:     General: Skin is warm and dry.  Neurological:      Mental Status: She is alert and oriented to person, place, and time.   Psychiatric:         Behavior: Behavior normal.          Lab:   Results for orders placed or performed in visit on 12/18/20   Glycosylated Hgb(A1C), Blood Lavender   Result Value Ref Range    Glyco Hgb (A1C) 5.9 (H) 4.8 - 5.8 %   Hepatitis C Antibody (Hep C Ab)   Result Value Ref Range    Hepatitis C Ab Non Reactive    Lipid Panel Green Plasma Separator Tube   Result Value Ref Range    Cholesterol 124 <200 mg/dL    HDL-Cholesterol 40 mg/dL    LDL-Chol  (Calc) 74 <160 mg/dL    Non-HDL Cholesterol 84 mg/dL    Triglycerides 50 10 - 170 mg/dL   Comprehensive Metabolic Panel   Result Value Ref Range    Glucose 112 (H) 70 - 99 mg/dL    BUN 13 6 - 20 mg/dL    Creatinine 0.73 0.51 - 0.95 mg/dL    eGFR Based on CKD-EPI 2021 Equation >60 mL/min    GFR >60 mL/min    Sodium 138 136 - 145 mmol/L    Potassium 4.5 3.5 - 5.1 mmol/L    Chloride 104 98 - 107 mmol/L    Bicarbonate 25 22 - 29 mmol/L    Anion Gap 9 7 - 15 mmol/L    Calcium 9.3 8.5 - 10.6 mg/dL    Total Protein 6.5 6.0 - 8.0 g/dL    Albumin 4.2 3.5 - 5.2 g/dL    Bilirubin, Tot 0.29 <1.2 mg/dL    AST (SGOT) 20 0 - 32 U/L    ALT (SGPT) 31 0 - 33 U/L    Alkaline Phos 48 40 - 130 U/L   CBC w/ Diff Lavender   Result Value Ref Range    WBC 6.2 4.0 - 10.0 1000/mm3    RBC 4.57 3.90 - 5.20 mill/mm3    Hgb 12.3 11.2 - 15.7 gm/dL    Hct 38.1 34.0 - 45.0 %    MCV 83.4 79.0 - 95.0 um3    MCH 26.9 26.0 - 32.0 pgm    MCHC 32.3 32.0 - 36.0 g/dL    RDW 13.1 12.0 - 14.0 %    MPV 10.7 9.4 - 12.4 fL    Plt Count 284 140 - 370 1000/mm3    Segs 50 %    Lymphocytes 39 %    Monocytes 8 %    Eosinophils 2 %    Basophils 1 %    ANC-Automated 3.1 1.6 - 7.0 1000/mm3    Abs Lymphs 2.4 0.8 - 3.1 1000/mm3    Abs Monos 0.5 0.2 - 0.8 1000/mm3    Abs Eosinophils 0.1 0.0 - 0.5 1000/mm3    Abs Basophils 0.1 <0.2 1000/mm3    Diff Type Automated        Health Maintenance   Topic Date Due    COVID-19 Vaccine (3 - Booster for Pfizer series) 12/29/2019    PHQ2 depression screen  12/24/2021    Tetanus (2 - Td or Tdap) 12/25/2030    Hepatitis C Screening  Completed    Influenza  Completed    Polio Vaccine  Aged Out    HPV Vaccine <= 38 Yrs  Aged Out    Meningococcal MCV4 Vaccine  Aged Out    Pneumococcal Vaccine  Aged  Out         Jaskirat was seen today for leg pain and results.    Diagnoses and all orders for this visit:    Pain in both lower extremities  Comments:  xmonths. Family member w/ lupus and pt concerned. ANA. Advised on motrin. No trauma, hx  of cancer. NSAIDs Reviewed last labs. No spasms. CTM.  Orders:  -     ANA (Anti-Nuclear Antibody) - See Instructions; Future    Breast nodule  Comments:  Bialteral breast, likely fibrocystic changes. +family hx of breast cancer. [place] and mammogram.   Orders:  -     US Breast Complete - Bilateral; Future  -     Digital Diagnostic Mammogram Bilateral; Future    Prediabetes  Comments:  Reviewed labs. Advised on diet and exercise.     Obesity (BMI 35.0-39.9 without comorbidity)  Comments:  Reviewed nutrition and exercise.  Orders:  -     Nutrition Clinic    Need for vaccination  Comments:  Flu vaccine.   Orders:  -     Flu Vaccine (Fluarix), IM, 6+ mo  -     TDAP Vaccine >7 YO, IM    Screened negative for significant alcohol use  Comments:  Reviewed.     Screened negative for drug use  Comments:  Reviewed.     Need for vaccination  Comments:  Tdap, FLu vaccine.   Orders:  -     Flu Vaccine (Fluarix), IM, 6+ mo  -     TDAP Vaccine >7 YO, IM        There are no Patient Instructions on file for this visit.    Medication Review:  Medications reviewed with patient and medication list reconciled.  Over the counter medications, herbal therapies and supplements reviewed.  Patient's understanding and response to medications assessed.   Barriers to medications assessed and addressed.   Risks, benefits, alternatives to medications reviewed.    Patient Instruction: See Patient Education Section    No barriers to learning, verbalizes understanding of teaching and instructions.    Sebastian Ache MD MPH  [facility]

## 2020-12-25 ENCOUNTER — Other Ambulatory Visit (INDEPENDENT_AMBULATORY_CARE_PROVIDER_SITE_OTHER): Payer: Self-pay | Admitting: Family Practice

## 2020-12-25 DIAGNOSIS — N63 Unspecified lump in unspecified breast: Secondary | ICD-10-CM

## 2020-12-26 ENCOUNTER — Other Ambulatory Visit: Payer: TRICARE Extra—PPO | Attending: Family Practice

## 2020-12-26 DIAGNOSIS — M79604 Pain in right leg: Secondary | ICD-10-CM

## 2020-12-26 DIAGNOSIS — M79605 Pain in left leg: Secondary | ICD-10-CM | POA: Insufficient documentation

## 2020-12-26 NOTE — Interdisciplinary (Signed)
Blood drawn from left arm with 21 gauge needle. 1 tubes taken.   Patient identity authenticated by Michelle Lehoski.

## 2020-12-27 LAB — ANA (ANTI-NUCLEAR AB), BLOOD: ANA (Anti-Nuclear Ab): NEGATIVE

## 2021-01-03 ENCOUNTER — Ambulatory Visit
Admission: RE | Admit: 2021-01-03 | Discharge: 2021-01-03 | Disposition: A | Discharge status: Home / Self Care | Payer: TRICARE Extra—PPO | Attending: Family Practice | Admitting: Family Practice

## 2021-01-03 ENCOUNTER — Ambulatory Visit (HOSPITAL_BASED_OUTPATIENT_CLINIC_OR_DEPARTMENT_OTHER)
Admit: 2021-01-03 | Discharge: 2021-01-03 | Disposition: A | Discharge status: Home Health | Payer: TRICARE Extra—PPO | Attending: Family Practice | Admitting: Family Practice

## 2021-01-03 ENCOUNTER — Other Ambulatory Visit: Payer: Self-pay

## 2021-01-03 ENCOUNTER — Encounter (INDEPENDENT_AMBULATORY_CARE_PROVIDER_SITE_OTHER): Payer: Self-pay | Admitting: Family Practice

## 2021-01-03 DIAGNOSIS — N63 Unspecified lump in unspecified breast: Secondary | ICD-10-CM

## 2021-01-03 DIAGNOSIS — N6320 Unspecified lump in the left breast, unspecified quadrant: Secondary | ICD-10-CM

## 2021-01-03 DIAGNOSIS — N6310 Unspecified lump in the right breast, unspecified quadrant: Secondary | ICD-10-CM

## 2021-01-03 DIAGNOSIS — N644 Mastodynia: Secondary | ICD-10-CM

## 2021-01-11 ENCOUNTER — Encounter: Payer: Self-pay | Admitting: Family Practice

## 2021-03-21 ENCOUNTER — Encounter (INDEPENDENT_AMBULATORY_CARE_PROVIDER_SITE_OTHER): Payer: TRICARE Extra—PPO | Admitting: Family Practice

## 2021-09-20 ENCOUNTER — Encounter (INDEPENDENT_AMBULATORY_CARE_PROVIDER_SITE_OTHER): Payer: TRICARE Extra—PPO | Admitting: Family Practice

## 2021-11-12 ENCOUNTER — Encounter (INDEPENDENT_AMBULATORY_CARE_PROVIDER_SITE_OTHER): Payer: Self-pay | Admitting: Family Practice

## 2021-11-12 ENCOUNTER — Other Ambulatory Visit: Payer: TRICARE Extra—PPO | Attending: Family Practice | Admitting: Family Practice

## 2021-11-12 VITALS — BP 105/68 | HR 72 | Temp 98.0°F | Resp 16 | Ht 69.0 in | Wt 250.0 lb

## 2021-11-12 DIAGNOSIS — Z13220 Encounter for screening for lipoid disorders: Secondary | ICD-10-CM | POA: Insufficient documentation

## 2021-11-12 DIAGNOSIS — Z114 Encounter for screening for human immunodeficiency virus [HIV]: Secondary | ICD-10-CM | POA: Insufficient documentation

## 2021-11-12 DIAGNOSIS — Z3201 Encounter for pregnancy test, result positive: Secondary | ICD-10-CM | POA: Insufficient documentation

## 2021-11-12 DIAGNOSIS — Z131 Encounter for screening for diabetes mellitus: Secondary | ICD-10-CM | POA: Insufficient documentation

## 2021-11-12 DIAGNOSIS — N912 Amenorrhea, unspecified: Secondary | ICD-10-CM | POA: Insufficient documentation

## 2021-11-12 DIAGNOSIS — Z1329 Encounter for screening for other suspected endocrine disorder: Secondary | ICD-10-CM | POA: Insufficient documentation

## 2021-11-12 DIAGNOSIS — Z124 Encounter for screening for malignant neoplasm of cervix: Secondary | ICD-10-CM

## 2021-11-12 LAB — COMPREHENSIVE METABOLIC PANEL, BLOOD
ALT (SGPT): 16 U/L (ref 0–33)
AST (SGOT): 17 U/L (ref 0–32)
Albumin: 4.2 g/dL (ref 3.5–5.2)
Alkaline Phos: 53 U/L (ref 40–130)
Anion Gap: 13 mmol/L (ref 7–15)
BUN: 15 mg/dL (ref 6–20)
Bicarbonate: 23 mmol/L (ref 22–29)
Bilirubin, Tot: 0.21 mg/dL (ref <1.2)
Calcium: 9.8 mg/dL (ref 8.5–10.6)
Chloride: 108 mmol/L — ABNORMAL HIGH (ref 98–107)
Creatinine: 0.58 mg/dL (ref 0.51–0.95)
Glucose: 118 mg/dL — ABNORMAL HIGH (ref 70–99)
Potassium: 3.8 mmol/L (ref 3.5–5.1)
Sodium: 144 mmol/L (ref 136–145)
Total Protein: 7.1 g/dL (ref 6.0–8.0)
eGFR Based on CKD-EPI 2021 Equation: >60 mL/min/{1.73_m2}

## 2021-11-12 LAB — CBC WITH DIFF, BLOOD
ANC-Automated: 3.7 10*3/uL (ref 1.6–7.0)
Abs Basophils: 0.1 10*3/uL (ref <0.2)
Abs Eosinophils: 0.1 10*3/uL (ref 0.0–0.5)
Abs Lymphs: 2.9 10*3/uL (ref 0.8–3.1)
Abs Monos: 0.7 10*3/uL (ref 0.2–0.8)
Basophils: 1 %
Eosinophils: 2 %
Hct: 38.4 % (ref 34.0–45.0)
Hgb: 12.2 gm/dL (ref 11.2–15.7)
Lymphocytes: 39 %
MCH: 26.9 pg (ref 26.0–32.0)
MCHC: 31.8 g/dL — ABNORMAL LOW (ref 32.0–36.0)
MCV: 84.6 um3 (ref 79.0–95.0)
MPV: 10.7 fL (ref 9.4–12.4)
Monocytes: 9 %
Plt Count: 284 10*3/uL (ref 140–370)
RBC: 4.54 10*6/uL (ref 3.90–5.20)
RDW: 13.5 % (ref 12.0–14.0)
Segs: 49 %
WBC: 7.5 10*3/uL (ref 4.0–10.0)

## 2021-11-12 LAB — TSH, BLOOD: TSH: 1.97 u[IU]/mL (ref 0.27–4.20)

## 2021-11-12 LAB — GLYCOSYLATED HGB(A1C), BLOOD: Glyco Hgb (A1C): 5.7 % (ref 4.8–5.8)

## 2021-11-12 LAB — HIV 1/2 ANTIBODY & P24 ANTIGEN ASSAY, BLOOD: HIV 1/2 Antibody & P24 Antigen Assay: NONREACTIVE

## 2021-11-12 LAB — HCG QUANTITATIVE, BLOOD: HCG Qt: 48 m[IU]/mL

## 2021-11-12 MED ORDER — DICLOFENAC SODIUM 1 % EX GEL
CUTANEOUS | Status: DC
Start: 2021-11-05 — End: 2021-11-12

## 2021-11-12 MED ORDER — NAPROXEN 375 MG OR TABS
ORAL_TABLET | ORAL | Status: DC
Start: 2021-11-05 — End: 2021-11-12

## 2021-11-12 MED ORDER — ACETAMINOPHEN 325 MG PO TABS
ORAL_TABLET | ORAL | Status: DC
Start: 2021-11-05 — End: 2021-12-18

## 2021-11-12 NOTE — Patient Instructions (Addendum)
Felicidades, su prueba de embarazada esta positivo. Por favor no toma los anti-inflammotorios (incluye ibuprofeno y naproxena). Puede tomar acetaminofena (tylenol).

## 2021-11-12 NOTE — Progress Notes (Signed)
Chief Complaint:  Annual Exam (CPE w Pap; No other concerns)      Subjective:    Linda Landry is a 31 year old female who presents with   Chief Complaint   Patient presents with    Annual Exam     CPE w Pap; No other concerns       Patient here for follow up visit    #Positive pregnancy test: Last date of menses 26 June, 2023. For sure dating. Has some breast tenderness. No nausea. No vaginal bleeding. Currently taking tylenol and naproxen.     #Mood: Thinks she has depression. Some days will cry often. Some days cries often. Husband in the [employer]. Husband will go to [place] for one month in November.     Reviewed past medical history, surgical, family, medication and social history.   Reviewed outside medical records if available.         11/12/2021    10:45 AM 12/24/2020    11:28 AM 07/23/2020     1:10 PM   FM PHQ2   PHQ2 Total 0 0 2         12/24/2020    11:28 AM 07/23/2020     1:10 PM   FM PHQ9 score   PHQ9 Patient Summary Score (calculated) 0 12         07/23/2020     1:12 PM 12/24/2020    11:27 AM 12/24/2020     1:05 PM 11/12/2021    10:45 AM   GAD 7   1. Feeling nervous, anxious or on edge 1  0 0   2. Not being able to stop or control worrying 0  0 0   3. Worrying too much about different things 1 0 0 0   4. Trouble relaxing 1 0 0 0   5. Being so restless that it is hard to sit still 0 0 0 0   6. Being easily annoyed or irritable 3 0 0 0   7. Feeling afraid as if something awful might happen 0 0 0 0   GAD7 Patient Total 6  0 0   If you checked off any problems, how difficult have these problems made it for you to do your job along with other people? Somewhat difficult Not difficult at all Not difficult at all Not difficult at all         Review of Systems   Constitutional:  Negative for fever.   HENT:  Negative for sore throat.    Respiratory:  Negative for shortness of breath.    Cardiovascular:  Negative for chest pain.   Gastrointestinal:  Negative for constipation, diarrhea, nausea and vomiting.    Genitourinary:  Negative for dysuria.   Skin:  Negative for rash.   Neurological:  Negative for headaches.           Current Outpatient Medications on File Prior to Visit   Medication Sig Dispense Refill    acetaminophen (TYLENOL) 325 MG tablet       [DISCONTINUED] diclofenac (VOLTAREN) 1 % gel       [DISCONTINUED] naproxen (NAPROSYN) 375 MG tablet        No current facility-administered medications on file prior to visit.       No Known Allergies    Patient Active Problem List   Diagnosis    Pelvic pain    Prediabetes    Obesity (BMI 35.0-39.9 without comorbidity)    Female fertility problem  No past medical history on file.    Past Surgical History:   Procedure Laterality Date    CESAREAN SECTION, CLASSIC         Social History     Socioeconomic History    Marital status: Married     Spouse name: Not on file    Number of children: Not on file    Years of education: Not on file    Highest education level: Not on file   Occupational History    Not on file   Tobacco Use    Smoking status: Never    Smokeless tobacco: Never   Substance and Sexual Activity    Alcohol use: Yes     Comment: rarely    Drug use: Never    Sexual activity: Yes     Partners: Male   Other Topics Concern    Not on file   Social History Narrative    Not on file     Social Determinants of Health     Financial Resource Strain: Not on file   Food Insecurity: Not on file   Transportation Needs: Not on file   Physical Activity: Not on file   Stress: Not on file   Social Connections: Not on file   Intimate Partner Violence: Not on file   Housing Stability: Not on file       Family Status   Relation Status    Mo (Not Specified)    Fa (Not Specified)       Objective:  Vitals:    11/12/21 1047   BP: 105/68   BP Location: Left arm   BP Patient Position: Sitting   BP cuff size: Large   Pulse: 72   Resp: 16   Temp: 98 F (36.7 C)   TempSrc: Temporal   SpO2: 100%   Weight: 113.4 kg (250 lb)   Height: 5\' 9"  (1.753 m)     Estimated body mass index is  36.92 kg/m as calculated from the following:    Height as of this encounter: 5\' 9"  (1.753 m).    Weight as of this encounter: 113.4 kg (250 lb).    Physical Exam  Vitals reviewed.   Constitutional:       General: She is not in acute distress.     Appearance: Normal appearance. She is obese. She is not diaphoretic.   HENT:      Head: Normocephalic and atraumatic.      Nose: Nose normal.   Eyes:      Extraocular Movements: Extraocular movements intact.      Conjunctiva/sclera: Conjunctivae normal.   Cardiovascular:      Rate and Rhythm: Normal rate and regular rhythm.      Pulses: Normal pulses.      Heart sounds: Normal heart sounds.   Pulmonary:      Effort: Pulmonary effort is normal. No respiratory distress.      Breath sounds: Normal breath sounds. No wheezing, rhonchi or rales.   Abdominal:      General: Bowel sounds are normal. There is no distension.      Palpations: Abdomen is soft.      Tenderness: There is no abdominal tenderness. There is no guarding or rebound.   Musculoskeletal:         General: Normal range of motion.      Cervical back: Normal range of motion.   Skin:     General: Skin is warm and dry.   Neurological:  Mental Status: She is alert and oriented to person, place, and time.   Psychiatric:         Behavior: Behavior normal.          Lab:   Results for orders placed or performed in visit on 12/26/20   ANA (Anti-Nuclear Antibody) - See Instructions   Result Value Ref Range    ANA (Anti-Nuclear Ab) Negative Negative       Health Maintenance   Topic Date Due    Cervical Cancer Screening  Never done    Universal HIV Screening  Never done    COVID-19 Vaccine (3 - Pfizer series) 09/23/2019    Influenza (1) 12/13/2021    PHQ2 depression screen  12/24/2021    Lipid Screening  12/18/2025    Tetanus (2 - Td or Tdap) 12/25/2030    Hepatitis C Screening  Completed    Polio Vaccine  Aged Out    IMM_Hep A Vaccine Series  Aged Out    HPV Vaccine <= 82 Yrs  Aged Out    Meningococcal MCV4 Vaccine  Aged  Out    Pneumococcal Vaccine  Aged Out         Linda Landry was seen today for annual exam.    Diagnoses and all orders for this visit:    Amenorrhea  Comments:  +breast tenderness. Sure date LMP, EDC 07/14/2022, [redacted]w[redacted]d. Reviewed ER precautions, diabetes screen, f/u pre-eclampsia risk.  Orders:  -     Urine Pregnancy Test (POCT)    Positive pregnancy test  Comments:  Beta HcG for confirmation. Pt trying for pregnancy for years. Reviewed medications to avoid in pregnancy.  Orders:  -     CBC w/ Diff Lavender; Future  -     Comprehensive Metabolic Panel; Future  -     Consult/Refer to [facility]  -     HCG Quantitative, Blood Green Plasma Separator Tube; Future  -     HCG Quantitative, Blood Green Plasma Separator Tube  -     Comprehensive Metabolic Panel  -     CBC w/ Diff Lavender    Screening for cervical cancer    Screening for HIV (human immunodeficiency virus)  -     HIV 1/2 Antibody & P24 Antigen Assay; Future  -     HIV 1/2 Antibody & P24 Antigen Assay    Screening for thyroid disorder  -     TSH, Blood - See Instructions; Future  -     TSH, Blood - See Instructions    Screening for lipid disorders  -     Lipid Panel Green Plasma Separator Tube; Future    Screening for diabetes mellitus (DM)  -     Glycosylated Hgb(A1C), Blood Lavender; Future  -     Glycosylated Hgb(A1C), Blood Lavender        Patient Instructions   Felicidades, su prueba de embarazada esta positivo. Por favor no toma los anti-inflammotorios (incluye ibuprofeno y naproxena). Puede tomar acetaminofena (tylenol).     Medication Review:  Medications reviewed with patient and medication list reconciled.  Over the counter medications, herbal therapies and supplements reviewed.  Patient's understanding and response to medications assessed.   Barriers to medications assessed and addressed.   Risks, benefits, alternatives to medications reviewed.    Patient Instruction: See Patient Education Section    No barriers to learning, verbalizes  understanding of teaching and instructions.    Dionicia Abler MD MPH  [facility]  Family Medicine

## 2021-11-14 ENCOUNTER — Encounter (INDEPENDENT_AMBULATORY_CARE_PROVIDER_SITE_OTHER): Payer: Self-pay | Admitting: Family Practice

## 2021-11-19 ENCOUNTER — Telehealth (INDEPENDENT_AMBULATORY_CARE_PROVIDER_SITE_OTHER): Payer: Self-pay | Admitting: Family Practice

## 2021-11-19 NOTE — Telephone Encounter (Signed)
Who is calling: Incoming call from patient  Insurance Coverage Verified: Active- in network  Reason for this call: States she went to [facility] ER on Sunday with pelvic pain and was told she had an infection. Pt states she is about [redacted] weeks pregnant and had tests done to check her hormone levels. Per pt, they told her at ER that she may have a ectopic pregnancy. States she was will be having a biopsy done tomorrow at 8:30am and wanted to know what Dr. Barry Dienes suggested she does. Please advise     Action required by office: Please contact caller     Duplicate encounter? No previous documentation found on this issue.     Best way to contact: [phone]     Inquiry has been read verbatim to this caller. Verbalizes satisfaction and confirms the above is accurate: no      Has been advised this message will be transmitted to office and can expect a response within the next 24-72 hours.

## 2021-11-20 NOTE — Telephone Encounter (Signed)
Requesting advice and recommendations from pcp. Will route to pcp and clinic.

## 2021-11-20 NOTE — Telephone Encounter (Signed)
Linda Landry spanish interpreter [phone]    I have attempted to contact this patient by phone with the following results: called patient, no answer, .  Mychart message also sent to pt.     Also left the following message:  --call back to discuss symptoms and concerns with a triage RN between 0800 and 1700.   --If calling after 1700, pt will have an opportunity to speak to the on-call provider  --For any worsening symptoms and needs to be seen today, pt to go the nearest Urgent Care  --For any severe symptoms, proceed to the nearest emergency room or call 911  --Triage phone number included, [phone]    CALL CENTER:  Please transfer to red flag line as message includes red flag symptoms

## 2021-11-20 NOTE — Telephone Encounter (Signed)
Called pt and informed of Dr. Barry Dienes message. Pt VU, states she is at the [employer] [place] at the gyn office. States she is having a type of biopsy to know if she has a normal pregnancy or an ectopic. Encouraged pt to stay for a work up. Reiterated Dr. Barry Dienes message. Aware to call us for f/u. Pt VU.    Robbie Louis, Chief Financial Officer Team  [facility] [phone]

## 2021-11-20 NOTE — Telephone Encounter (Signed)
Reviewed. Recommend patient go in for her scheduled evaluation today. Pregnancy management will be discussed and pregnancy will be terminated (surgery vs. Medication) given risks from ectopic pregnancy.

## 2021-11-22 ENCOUNTER — Encounter (INDEPENDENT_AMBULATORY_CARE_PROVIDER_SITE_OTHER): Payer: TRICARE Extra—PPO | Admitting: Family

## 2021-12-15 ENCOUNTER — Telehealth (INDEPENDENT_AMBULATORY_CARE_PROVIDER_SITE_OTHER): Payer: Self-pay | Admitting: Family Practice

## 2021-12-15 NOTE — Telephone Encounter (Signed)
Placed call to patient to informing them that visit has been changed to video/ or telephone on 12/17/2021 with Dr. Owens     Provider will be OOO.     Left message for patient to call back.  Call Center agent, if applicable please assist with:      Relaying Message      Okay to schedule telephone/ Video Visit with Provider Navarro, Blanca as return       Verify best way to reach patient  Please don't create new encounter.  Thank you.

## 2021-12-17 ENCOUNTER — Encounter (INDEPENDENT_AMBULATORY_CARE_PROVIDER_SITE_OTHER): Payer: TRICARE Extra—PPO | Admitting: Family Practice

## 2021-12-17 ENCOUNTER — Encounter (INDEPENDENT_AMBULATORY_CARE_PROVIDER_SITE_OTHER): Payer: Self-pay | Admitting: Hospital

## 2021-12-17 DIAGNOSIS — Z124 Encounter for screening for malignant neoplasm of cervix: Secondary | ICD-10-CM

## 2021-12-18 ENCOUNTER — Telehealth (INDEPENDENT_AMBULATORY_CARE_PROVIDER_SITE_OTHER): Payer: TRICARE Extra—PPO | Admitting: Family Practice

## 2021-12-18 DIAGNOSIS — E669 Obesity, unspecified: Secondary | ICD-10-CM

## 2021-12-18 DIAGNOSIS — Z8759 Personal history of other complications of pregnancy, childbirth and the puerperium: Secondary | ICD-10-CM

## 2021-12-18 NOTE — Progress Notes (Signed)
---------------------(data below generated by Jason Nest, MD)--------------------     Patient Verification & Telemedicine Consent:      I am proceeding with this evaluation at the direct request of the patient.  I have verified this is the correct patient and have obtained verbal consent and written consent from the patient/ surrogate to perform this voluntary telemedicine evaluation (including obtaining history, performing examination and reviewing data provided by the patient).   The patient/ surrogate has the right to refuse this evaluation.  I have explained risks (including potential loss of confidentiality), benefits, alternatives, and the potential need for subsequent face to face care. Patient/ surrogate understands that there is a risk of medical inaccuracies given that our recommendations will be made based on reported data (and we must therefore assume this information is accurate).  Knowing that there is a risk that this information is not reported accurately, and that the telemedicine video, audio, or data feed may be incomplete, the patient agrees to proceed with evaluation and holds [place] harmless knowing these risks. In this evaluation, we will be providing recommendations only.  The ultimate decision to follow, or not follow, these recommendations will be left to the bedside treating/ requesting practitioner.  The patient/ surrogate has been notified that other healthcare professionals (including students, residents and Metallurgist) may be involved in this audio-video evaluation.   All laws concerning confidentiality and patient access to medical records and copies of medical records apply to telemedicine.  The patient/ surrogate has received the [place] Notice of Privacy Practices.  I have reviewed this above verification and consent paragraph with the patient/ surrogate.  If the patient is not capacitated to understand the above, and no surrogate is available, since this is not an emergency  evaluation, the visit will be rescheduled until such time that the patient can consent, or the surrogate is available to consent.    Demographics:   Medical Record #: [id number]   Date: December 18, 2021   Patient Name: Linda Landry   DOB: 10-Mar-1991  Age: 31 year old  Sex: female  Location: Home address on file      Evaluator(s):   Linda Landry was evaluated by me today.    Clinic Location:  [facility]  [address]  [place] [address]       Due to COVID-19 pandemic and a federally declared state of public health emergency, I am proceeding with this video evaluation at the request of the patient. I have verified this the correct patient and have obtained verbal consent from the patient to perform this voluntary evaluation. Pt is self isolating as per [employer].      I personally spent 20 minutes  in direct management of the patient today excluding separately reportable services/procedures.     Chief Complaint:  Follow Up      Subjective:    Linda Landry is a 31 year old female who presents with   Chief Complaint   Patient presents with    Follow Up       Patient here for follow up visit.    #Ectopic pregnancy: Completed treatment at [facility]. Last week completed follow up and was doing well. Will have another appointment this month. Denies any active symptoms.     Reviewed past medical history, surgical, family, medication and social history.   Reviewed outside medical records if available.  12/18/2021     3:57 PM 11/12/2021    10:45 AM 12/24/2020    11:28 AM 07/23/2020     1:10 PM   FM PHQ2   PHQ2 Total 0 0 0 2         12/24/2020    11:28 AM 07/23/2020     1:10 PM   FM PHQ9 score   PHQ9 Patient Summary Score (calculated) 0 12         07/23/2020     1:12 PM 12/24/2020    11:27 AM 12/24/2020     1:05 PM 11/12/2021    10:45 AM   GAD 7   1. Feeling nervous, anxious or on edge 1  0 0   2. Not being able to stop or  control worrying 0  0 0   3. Worrying too much about different things 1 0 0 0   4. Trouble relaxing 1 0 0 0   5. Being so restless that it is hard to sit still 0 0 0 0   6. Being easily annoyed or irritable 3 0 0 0   7. Feeling afraid as if something awful might happen 0 0 0 0   GAD7 Patient Total 6  0 0   If you checked off any problems, how difficult have these problems made it for you to do your job along with other people? Somewhat difficult Not difficult at all Not difficult at all Not difficult at all         Review of Systems    Current Outpatient Medications on File Prior to Visit   Medication Sig Dispense Refill    [DISCONTINUED] acetaminophen (TYLENOL) 325 MG tablet        No current facility-administered medications on file prior to visit.       No Known Allergies    Patient Active Problem List   Diagnosis    Pelvic pain    Prediabetes    Obesity (BMI 35.0-39.9 without comorbidity)    Female fertility problem       No past medical history on file.    Past Surgical History:   Procedure Laterality Date    CESAREAN SECTION, CLASSIC         Social History     Socioeconomic History    Marital status: Married     Spouse name: Not on file    Number of children: Not on file    Years of education: Not on file    Highest education level: Not on file   Occupational History    Not on file   Tobacco Use    Smoking status: Never    Smokeless tobacco: Never   Substance and Sexual Activity    Alcohol use: Yes     Comment: rarely    Drug use: Never    Sexual activity: Yes     Partners: Male   Other Topics Concern    Not on file   Social History Narrative    Not on file     Social Determinants of Health     Financial Resource Strain: Not on file   Food Insecurity: Not on file   Transportation Needs: Not on file   Physical Activity: Not on file   Stress: Not on file   Social Connections: Not on file   Intimate Partner Violence: Not on file   Housing Stability: Not on file       Family Status   Relation Status  Mo (Not  Specified)    Fa (Not Specified)       Objective:  There were no vitals filed for this visit.  Estimated body mass index is 36.92 kg/m as calculated from the following:    Height as of 11/12/21: 5' 9"  (1.753 m).    Weight as of 11/12/21: 113.4 kg (250 lb).    Physical Exam  General: In NAD, A&O, pleasant, non-toxic appearing.  HEENT: NC/AT. Anicteric. Non-injected.   Lungs: Normal respiratory movement.   Psychiatric: Well dressed and groomed, normal affect, good insight and judgment, memory intact.     Lab:   Results for orders placed or performed in visit on 11/12/21   HCG Quantitative, Blood Green Plasma Separator Tube   Result Value Ref Range    HCG Qt 48 mIU/mL   Glycosylated Hgb(A1C), Blood Lavender   Result Value Ref Range    Glyco Hgb (A1C) 5.7 4.8 - 5.8 %   TSH, Blood - See Instructions   Result Value Ref Range    TSH 1.97 0.27 - 4.20 uIU/mL   Comprehensive Metabolic Panel   Result Value Ref Range    Glucose 118 (H) 70 - 99 mg/dL    BUN 15 6 - 20 mg/dL    Creatinine 0.58 0.51 - 0.95 mg/dL    eGFR Based on CKD-EPI 2021 Equation >60 mL/min/1.73 m2    Sodium 144 136 - 145 mmol/L    Potassium 3.8 3.5 - 5.1 mmol/L    Chloride 108 (H) 98 - 107 mmol/L    Bicarbonate 23 22 - 29 mmol/L    Anion Gap 13 7 - 15 mmol/L    Calcium 9.8 8.5 - 10.6 mg/dL    Total Protein 7.1 6.0 - 8.0 g/dL    Albumin 4.2 3.5 - 5.2 g/dL    Bilirubin, Tot 0.21 <1.2 mg/dL    AST (SGOT) 17 0 - 32 U/L    ALT (SGPT) 16 0 - 33 U/L    Alkaline Phos 53 40 - 130 U/L   CBC w/ Diff Lavender   Result Value Ref Range    WBC 7.5 4.0 - 10.0 1000/mm3    RBC 4.54 3.90 - 5.20 mill/mm3    Hgb 12.2 11.2 - 15.7 gm/dL    Hct 38.4 34.0 - 45.0 %    MCV 84.6 79.0 - 95.0 um3    MCH 26.9 26.0 - 32.0 pgm    MCHC 31.8 (L) 32.0 - 36.0 g/dL    RDW 13.5 12.0 - 14.0 %    MPV 10.7 9.4 - 12.4 fL    Plt Count 284 140 - 370 1000/mm3    Segs 49 %    Lymphocytes 39 %    Monocytes 9 %    Eosinophils 2 %    Basophils 1 %    ANC-Automated 3.7 1.6 - 7.0 1000/mm3    Abs Lymphs 2.9 0.8 -  3.1 1000/mm3    Abs Monos 0.7 0.2 - 0.8 1000/mm3    Abs Eosinophils 0.1 0.0 - 0.5 1000/mm3    Abs Basophils 0.1 <0.2 1000/mm3    Diff Type Automated    HIV 1/2 Antibody & P24 Antigen Assay   Result Value Ref Range    HIV 1/2 Antibody & P24 Antigen Assay Non Reactive        Health Maintenance   Topic Date Due    Cervical Cancer Screening  Never done    COVID-19 Vaccine (3 - Pfizer series) 09/23/2019  Influenza (1) 02/12/2022    PHQ2 depression screen  11/13/2022    Lipid Screening  12/18/2025    Tetanus (2 - Td or Tdap) 12/25/2030    Hepatitis C Screening  Completed    Universal HIV Screening  Completed    Polio Vaccine  Aged Out    IMM_Hep A Vaccine Series  Aged Out    HPV Vaccine <= 82 Yrs  Aged Out    Meningococcal MCV4 Vaccine  Aged Out    Pneumococcal Vaccine  Aged Out         Linda Landry was seen today for follow up.    Diagnoses and all orders for this visit:    Obesity (BMI 35.0-39.9 without comorbidity)  Comments:  Reviewed wegovy not covered by insurance. Referral for obesity medicine, pt desires to try alternative medication.  Orders:  -     Consult/Referral to Bariatric Surgery & Weight Management    S/P ectopic pregnancy  Comments:  ROI for [facility] for care management. Pt feeling improved. Desires weight loss before attempts for pregnancy. CTM.        Medication Review:  Medications reviewed with patient and medication list reconciled.  Over the counter medications, herbal therapies and supplements reviewed.  Patient's understanding and response to medications assessed.   Barriers to medications assessed and addressed.   Risks, benefits, alternatives to medications reviewed.    Patient Instruction: See Patient Education Section    No barriers to learning, verbalizes understanding of teaching and instructions.    Sebastian Ache MD MPH  [facility]

## 2021-12-27 ENCOUNTER — Encounter (INDEPENDENT_AMBULATORY_CARE_PROVIDER_SITE_OTHER): Payer: Self-pay

## 2021-12-27 NOTE — Telephone Encounter (Signed)
Call Center if pt calls back and wants a "weight management consultation" after reviewing MyChart message  - please schedule an Obesity Medicine Consultation.    If pt wants to schedule SLIM route message to CIM Navigator Pool and if pt has more questions they can call [phone].    If patient wants an Individual Health Coach, please route back to me.     If patient wants to join any of the other programs please route to Jeanette Limcolioc, the Program Coordinator.

## 2021-12-31 ENCOUNTER — Ambulatory Visit (HOSPITAL_BASED_OUTPATIENT_CLINIC_OR_DEPARTMENT_OTHER): Payer: TRICARE Extra—PPO | Admitting: Obstetrics & Gynecology

## 2022-06-03 ENCOUNTER — Encounter (INDEPENDENT_AMBULATORY_CARE_PROVIDER_SITE_OTHER): Payer: TRICARE Extra—PPO | Admitting: Gynecology

## 2022-08-15 ENCOUNTER — Encounter (INDEPENDENT_AMBULATORY_CARE_PROVIDER_SITE_OTHER): Payer: Self-pay | Admitting: Hospital

## 2023-04-21 ENCOUNTER — Encounter (INDEPENDENT_AMBULATORY_CARE_PROVIDER_SITE_OTHER): Payer: Self-pay | Admitting: Hospital

## 2023-12-02 ENCOUNTER — Encounter (INDEPENDENT_AMBULATORY_CARE_PROVIDER_SITE_OTHER): Payer: Self-pay | Admitting: Hospital
